# Patient Record
Sex: Male | Born: 1950 | Race: Black or African American | Hispanic: No | Smoking: Current every day smoker
Health system: Southern US, Community
[De-identification: ages and names within clinical notes are randomized; demographics above are authoritative.]

## PROBLEM LIST (undated history)

## (undated) DIAGNOSIS — K2970 Gastritis, unspecified, without bleeding: Secondary | ICD-10-CM

## (undated) DIAGNOSIS — I639 Cerebral infarction, unspecified: Secondary | ICD-10-CM

## (undated) DIAGNOSIS — B1910 Unspecified viral hepatitis B without hepatic coma: Secondary | ICD-10-CM

## (undated) HISTORY — PX: JOINT REPLACEMENT: SHX530

## (undated) HISTORY — PX: BACK SURGERY: SHX140

## (undated) HISTORY — PX: OTHER SURGICAL HISTORY: SHX169

---

## 1997-09-02 ENCOUNTER — Emergency Department (HOSPITAL_COMMUNITY): Admission: EM | Admit: 1997-09-02 | Discharge: 1997-09-02 | Payer: Self-pay | Admitting: Emergency Medicine

## 2000-01-12 ENCOUNTER — Emergency Department (HOSPITAL_COMMUNITY): Admission: EM | Admit: 2000-01-12 | Discharge: 2000-01-12 | Payer: Self-pay | Admitting: Emergency Medicine

## 2000-05-03 ENCOUNTER — Emergency Department (HOSPITAL_COMMUNITY): Admission: EM | Admit: 2000-05-03 | Discharge: 2000-05-03 | Payer: Self-pay | Admitting: Emergency Medicine

## 2000-05-03 ENCOUNTER — Encounter: Payer: Self-pay | Admitting: Emergency Medicine

## 2001-03-06 ENCOUNTER — Encounter: Payer: Self-pay | Admitting: Emergency Medicine

## 2001-03-06 ENCOUNTER — Emergency Department (HOSPITAL_COMMUNITY): Admission: EM | Admit: 2001-03-06 | Discharge: 2001-03-06 | Payer: Self-pay | Admitting: Emergency Medicine

## 2001-03-07 ENCOUNTER — Encounter: Payer: Self-pay | Admitting: Emergency Medicine

## 2001-03-07 ENCOUNTER — Emergency Department (HOSPITAL_COMMUNITY): Admission: EM | Admit: 2001-03-07 | Discharge: 2001-03-07 | Payer: Self-pay | Admitting: Emergency Medicine

## 2002-05-22 ENCOUNTER — Emergency Department (HOSPITAL_COMMUNITY): Admission: EM | Admit: 2002-05-22 | Discharge: 2002-05-22 | Payer: Self-pay | Admitting: Emergency Medicine

## 2002-05-22 ENCOUNTER — Encounter: Payer: Self-pay | Admitting: Emergency Medicine

## 2003-01-16 ENCOUNTER — Emergency Department (HOSPITAL_COMMUNITY): Admission: EM | Admit: 2003-01-16 | Discharge: 2003-01-16 | Payer: Self-pay | Admitting: Emergency Medicine

## 2003-01-24 ENCOUNTER — Emergency Department (HOSPITAL_COMMUNITY): Admission: EM | Admit: 2003-01-24 | Discharge: 2003-01-24 | Payer: Self-pay | Admitting: Emergency Medicine

## 2003-03-30 ENCOUNTER — Emergency Department (HOSPITAL_COMMUNITY): Admission: AD | Admit: 2003-03-30 | Discharge: 2003-03-30 | Payer: Self-pay | Admitting: Emergency Medicine

## 2004-09-09 ENCOUNTER — Emergency Department (HOSPITAL_COMMUNITY): Admission: EM | Admit: 2004-09-09 | Discharge: 2004-09-09 | Payer: Self-pay | Admitting: Family Medicine

## 2004-11-14 ENCOUNTER — Emergency Department (HOSPITAL_COMMUNITY): Admission: EM | Admit: 2004-11-14 | Discharge: 2004-11-14 | Payer: Self-pay | Admitting: Emergency Medicine

## 2005-03-15 ENCOUNTER — Emergency Department (HOSPITAL_COMMUNITY): Admission: EM | Admit: 2005-03-15 | Discharge: 2005-03-15 | Payer: Self-pay | Admitting: Emergency Medicine

## 2006-04-18 ENCOUNTER — Emergency Department (HOSPITAL_COMMUNITY): Admission: EM | Admit: 2006-04-18 | Discharge: 2006-04-18 | Payer: Self-pay | Admitting: Family Medicine

## 2006-11-07 ENCOUNTER — Emergency Department (HOSPITAL_COMMUNITY): Admission: EM | Admit: 2006-11-07 | Discharge: 2006-11-07 | Payer: Self-pay | Admitting: Emergency Medicine

## 2007-04-23 ENCOUNTER — Emergency Department (HOSPITAL_COMMUNITY): Admission: EM | Admit: 2007-04-23 | Discharge: 2007-04-23 | Payer: Self-pay | Admitting: Emergency Medicine

## 2008-03-07 ENCOUNTER — Inpatient Hospital Stay (HOSPITAL_COMMUNITY): Admission: EM | Admit: 2008-03-07 | Discharge: 2008-03-11 | Payer: Self-pay | Admitting: Emergency Medicine

## 2010-04-04 ENCOUNTER — Emergency Department (HOSPITAL_COMMUNITY)
Admission: EM | Admit: 2010-04-04 | Discharge: 2010-04-04 | Payer: Self-pay | Source: Home / Self Care | Admitting: Emergency Medicine

## 2010-07-13 LAB — BASIC METABOLIC PANEL
BUN: 17 mg/dL (ref 6–23)
CO2: 21 mEq/L (ref 19–32)
Calcium: 9.1 mg/dL (ref 8.4–10.5)
Chloride: 103 mEq/L (ref 96–112)
Creatinine, Ser: 1.12 mg/dL (ref 0.4–1.5)
GFR calc Af Amer: >60 mL/min (ref >60)
GFR calc non Af Amer: >60 mL/min (ref >60)
Glucose, Bld: 94 mg/dL (ref 70–99)
Potassium: 3.5 mEq/L (ref 3.5–5.1)
Sodium: 138 mEq/L (ref 135–145)

## 2010-07-13 LAB — DIFFERENTIAL
Basophils Absolute: 0 10*3/uL (ref 0.0–0.1)
Basophils Relative: 0 % (ref 0–1)
Eosinophils Absolute: 0.1 10*3/uL (ref 0.0–0.7)
Eosinophils Relative: 1 % (ref 0–5)
Lymphocytes Relative: 32 % (ref 12–46)
Lymphs Abs: 2.4 10*3/uL (ref 0.7–4.0)
Monocytes Absolute: 0.5 10*3/uL (ref 0.1–1.0)
Monocytes Relative: 7 % (ref 3–12)
Neutro Abs: 4.5 10*3/uL (ref 1.7–7.7)
Neutrophils Relative %: 60 % (ref 43–77)

## 2010-07-13 LAB — CBC
HCT: 35.4 % — ABNORMAL LOW (ref 39.0–52.0)
Hemoglobin: 12.8 g/dL — ABNORMAL LOW (ref 13.0–17.0)
MCH: 30.4 pg (ref 26.0–34.0)
MCHC: 36.2 g/dL — ABNORMAL HIGH (ref 30.0–36.0)
MCV: 84.1 fL (ref 78.0–100.0)
Platelets: 318 10*3/uL (ref 150–400)
RBC: 4.21 MIL/uL — ABNORMAL LOW (ref 4.22–5.81)
RDW: 14.3 % (ref 11.5–15.5)
WBC: 7.4 10*3/uL (ref 4.0–10.5)

## 2010-07-13 LAB — RAPID URINE DRUG SCREEN, HOSP PERFORMED
Amphetamines: NOT DETECTED
Barbiturates: NOT DETECTED
Benzodiazepines: POSITIVE — AB
Cocaine: POSITIVE — AB
Opiates: NOT DETECTED
Tetrahydrocannabinol: POSITIVE — AB

## 2010-07-13 LAB — ETHANOL: Alcohol, Ethyl (B): 193 mg/dL — ABNORMAL HIGH (ref 0–10)

## 2010-09-14 NOTE — Discharge Summary (Signed)
NAMEROXY, FILLER               ACCOUNT NO.:  [id number]   MEDICAL RECORD NO.:  [id number]          PATIENT TYPE:  INP   LOCATION:  1433                         FACILITY:  [facility]   PHYSICIAN:  Michelene Gardener, MD    DATE OF BIRTH:  [birth year]   DATE OF ADMISSION:  03/07/2008  DATE OF DISCHARGE:  03/11/2008                               DISCHARGE SUMMARY   DISCHARGE DIAGNOSES:  1. 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  2. Somatoform disorder.  3. History of alcoholism.  4. History of tobacco abuse.  5. History of drug abuse.   DISCHARGE MEDICATIONS:  The patient will go home on the same medications  that include:   1. Multivitamin 1 tab p.o. once daily.  2. Ibuprofen as needed for pain.  3. Amitriptyline at bedtime.   CONSULTATIONS:  1. Neurology consult.  2. Psych consult.   PROCEDURES:  None.   RADIOLOGY STUDIES:  1. MRI of the cervical spine without contrast showed degenerative      change most  prominent in C3-C4 level.  2. MRI of the thoracic spine showed mild degenerative change without      compression.  3. MRI of the lumbar spine showed mild degenerative change most      significantly in L4-L5.  4.  CT scan of the head without contrast      on November 6th showed normal examination.  4. MRI of the brain without contrast showed no acute findings.  5. MRA of the brain without contrast showed no acute findings.   FOLLOWUP:  With the primary doctor in 1-2 weeks.   COURSE OF HOSPITALIZATION:  This is a 60 year old African American male  who presented to the hospital complaining of bilateral lower extremity  weakness and chronic back pain for the last 8 months.  The patient was  admitted to the hospital for further evaluation.  A CT scan of the head  was done and came to be normal.  MRI of the cervical spine, thoracic  spine, and lumbar spine were done, and they came to be negative apart  from mild degenerative changes.  Neurology evaluation was done.  The  patient was seen moving his lower extremities by nurses when he was not  watched.  Upon examination he can move his legs with pain stimuli.  Neurology thinks that he does not have anything neurological, and that  it might be compression disorder.  Psych evaluation was done, and the  patient was diagnosed with somatoform disorder.  On November 10 the  patient is totally back to normal.  He was  able to walk without any problems.  Seen by PT who recommended home  health PT.  The patient will be discharged home today.  Will be followed  with primary doctor within 1 week.   Total assessment time is 40 minutes.      Michelene Gardener, MD  Electronically Signed     NAE/MEDQ  D:  03/11/2008  T:  03/11/2008  Job:  [id number]

## 2010-09-14 NOTE — Consult Note (Signed)
NAME:  GARYSON, STELLY NO.:  [id number]   MEDICAL RECORD NO.:  [id number]          PATIENT TYPE:  EMS   LOCATION:  ED                           FACILITY:  [facility]   PHYSICIAN:  Melina Fiddler, MDDATE OF BIRTH:  05-Feb-1951   DATE OF CONSULTATION:  03/07/2008  DATE OF DISCHARGE:                                 CONSULTATION   REASON FOR CONSULTATION:  Mr. Naji is seen today in consultation at  the request of Dr. Ignacia Palma at [facility] emergency department to  evaluate for spinal cord contusion resulting in sensory deficit and/or  paralysis.   HISTORY OF PRESENT ILLNESS:  This is a 60 year old African American  gentleman with a long history of chronic pain, specifically chronic low  back pain in the lumbar region.  The patient states that he was working  today when he suddenly experienced a sharp and stabbing pain which began  in his low lumbar region and then radiated up his spine towards the base  of his neck.  The patient states after this pain, he felt numb from the  neck down, and paralysed as if he could not move his arms and legs.  He  was promptly transported to the [facility] emergency department for  concerns for spinal cord infarct.  On presentation, his pain was 10/10.  The patient has been evaluated with MRI of the total spine in the [facility] emergency department, and has his strength and sensation have  slowly improved over several hours.  He does report that he was recently  evaluated by a neurosurgeon with consideration for lumbar surgery to  relieve his pain.  It was recommended to him that surgical intervention  was not an option.  He is currently awaiting a second opinion from  another neurosurgeon in [place].  At today's visit, the patient  states that his pain is significantly exacerbated in all positions.  He  has difficulty sitting for long periods of time.  He has difficulty  walking as well as difficulty lying flat.  He is  able to lie on his  side.  While in the emergency department, the patient received several  pain medications.  He has received approximately 6 mg of hydromorphone,  25 mg of Benadryl, one Percocet and 8 mg of Zofran.   OTHER PAST MEDICAL HISTORY:  1. Total right knee replacement.  2. Left ankle orthopedic procedure status post crush injury.  3. Chronic pain, specifically chronic low back pain.  4. Hepatitis C.  5. History of migraine headache.  6. Ill-defined kidney problems, possibly chronic renal insufficiency      secondary to NSAID use.  7. Depression.  8. Gastroesophageal reflux disease.  9. Polysubstance abuse, + marj, +cocaine   MEDICATIONS:  The patient reports no current home medications.   ALLERGIES:  NSAIDS, TYLENOL AND MOTRIN.  HE ALSO REPORTS THAT HE HAS  SOME SORT OF REACTION TO ALL OVER-THE-COUNTER PAIN RELIEVERS DUE TO THE  FACT THAT HE HAS TAKEN SO MANY OF THEM OVER HIS LIFETIME.   FAMILY HISTORY:  Noncontributory.   SOCIAL HISTORY:  He is a current smoker.  He reports occasional alcohol,  however the medical computer record indicates a history of alcohol  abuse.  He reports current cannabis use.   REVIEW OF SYSTEMS:  As detailed above under HPI, otherwise the balance  of 10 systems is unremarkable.  Specifically, the patient does not  report any bladder or bowel urgency, nor does he report any urinary  retention or difficulty passing stool.   PHYSICAL EXAMINATION:  VITAL SIGNS:  Current blood pressure is 132/84,  pulse of 93, temperature 97.9, respiratory rate 20.  He is sating 100%  on room air.  HEENT:  Head is normocephalic, atraumatic.  Pupils are equal, round,  reactive to light and accommodation.  Extraocular movements are full  without nystagmus.  Oropharynx clear without ulcers or exudate.  Palate  is symmetric and uvula midline.  NECK:  Supple.  LUNGS:  Clear to auscultation bilaterally.  HEART:  Regular rate and rhythm.  ABDOMEN:  Benign.   PERIPHERAL EXTREMITIES:  Warm, dry and well perfused without clubbing,  cyanosis or edema.  Of note, the patient does have two well-healed  lesions on the right forearm.  He states that these are areas where he  has had needle sticks in the past and they have not healed well.  NEUROLOGIC:  The patient is alert and oriented x3 to person, place and  time.  Memory to both recent and remote events is intact.  Attention and  concentration are within normal limits.  Language is fluent when the  patient is fully engaged, but he mumbles a lot of answers and does not  make good eye contact.  Cranial nerves II through XII are grossly  intact.  MUSCULOSKELETAL:  The patient exhibits normal tone with reduced bulk  throughout.  He appears to be poorly nourished.  There is no pronator  drift.  Strength is -5 to 5/5 throughout, depending an effort.  He does  have some mild give-way weakness and some weakness secondary to low back  pain, specifically his right hip flexors are +4/5 secondary to pain.  Toes are downgoing bilaterally.  DTRs are +1 at the ankles, +2 at the  left patellar tendon and +1 on the right, most likely secondary to total  knee replacement.  In the upper extremities biceps are 2+, triceps and  supinator are 1+.  The patient has smooth pursuit on finger-to-nose  testing.  Gait:  The patient refused to ambulate but did sit on the side  of the bed with legs dangling over.  He sat without assistance and was  steady and able to participate fully throughout the entire exam.  Sensation appears to be intact to pinprick and light touch.  The patient  does have mild to moderate decreased vibratory sense in his distal lower  extremities.  This is more notable on the left greater than the right.   CT of the head was within normal limits.  MRI of the total spine did not  show any evidence for spinal cord infarct, demyelination or myelopathy.  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  Chem-7 was within normal limits.  The patient has  elevated transaminases, AST 65, ALT 55, albumin is low at 3.4, one set  of cardiac enzymes is normal.   ASSESSMENT AND PLAN:  This is a 60 year old African American gentleman  with a history of chronic low back pain.  After neurologic examination  and neuro imaging;  the patient's exam has improved significantly over  the last several hours while in the [facility] emergency department.  At present he is able to sit up on the side of the bed without  assistance.  He is steady.  He is able to fully participate in the  Neurologic exam and has near full strength with normal reflexes and  downgoing toes.  He has no evidence on neurologic exam for spinal cord  infarct, demyelination or myelopathy, and specifically no focal lesion  is seen and his exam does not localize to any particular spinal level.  The patient has received a number of pain medications as well as  antiemetics while in the emergency department and is being admitted for  observation.  I do not feel that he has any acute neurologic issue on  his exam, I would consider while hospitalized or as an outpatient  checking a B12 level as well as an RPR due to his decreased vibratory  sense in his distal lower extremities and his h/o of polysubstance  abuse.  He also appears to be poorly nourished with a low albumin.  I  recommend thiamine and folate supplementation and monitoring for EtOH  withdrawal. For depression, one could consider starting an SSRI or  amitriptyline.  Amitriptyline is generic and the patient may be  compliant with this due to affordability.  This patient will be followed  by [facility] in the a.m.Melina Fiddler, MD  Electronically Signed     NA/MEDQ  D:  03/07/2008  T:  03/08/2008  Job:  [id number]

## 2010-09-14 NOTE — H&P (Signed)
NAMEJAHZIER, [name]               ACCOUNT NO.:  [id number]   MEDICAL RECORD NO.:  [id number]          PATIENT TYPE:  INP   LOCATION:  1433                         [facility]ACILITY[facility]  [facility]   PHYSICIAN:  Eduard Clos, MDDATE OF BIRTH:  1950/10/26   DATE OF ADMISSION:  03/07/2008  DATE OF DISCHARGE:                              HISTORY & PHYSICAL   PRIMARY CARE PHYSICIAN:  Unassigned.   CHIEF COMPLAINT:  Lower extremity weakness.   HISTORY OF PRESENT ILLNESS:  This is a 60 year old male with history of  chronic alcoholism,  tobacco abuse, drug abuse, presents to the ER  complaining of increasing weakness in the lower extremities.  The  patient states that he has been having chronic low back pain over the  last eight months.  At times gets weakness of his legs but today noted  sharp, shooting pain all the way up from his lumbar to his neck after  which he started developing some sudden weakness of both lower  extremities, unable to walk.  The patient had an MRI of his C-spine, T-  spine and lumbar spine.  No changes seen at this time but the patient  when tried to be mobilized, almost fell but did not lose consciousness.  The patient has been admitted for further evaluation and management.  Neurology has  been already called and consulted.   The patient denies any fever or chills, loss of consciousness, weakness  of upper extremities.  Denies any incontinence of urine.  Denies any  headache.  The pain in his low back has been there for eight months and  at times increases and goes up all the way to his C-spine area.  Presently the patient is not in any acute distress; denies any chest  pain, shortness of breath. Denies any nausea, vomiting, diarrhea, fever,  chills.   PAST MEDICAL HISTORY:  Nothing significant.   PAST SURGICAL HISTORY:  Right knee replacement and left ankle surgery.   MEDICATIONS PRIOR TO ADMISSION:  None.   ALLERGIES:  No known drug allergies.   SOCIAL  HISTORY:  The patient lives with his wife.  He smokes cigarettes  daily.  He has been advised to quit smoking.  Drinks a beer daily.  He  has been advised to quit drinking.  At times he takes marijuana and  cocaine which has been noted by his family to have been taken.   FAMILY HISTORY:  Nothing contributory.   REVIEW OF SYSTEMS:  As in history of present illness, nothing else  significant.   PHYSICAL EXAMINATION:  GENERAL:  The patient is examined at the bedside.  Not in acute distress.  VITAL SIGNS: Blood pressure 130/80, pulse 90 per minute and regular,  temperature 97.9, resection 18, oxygen saturation 100%.  HEENT:  Anicteric. No pallor.  PERRLA positive.  No facial asymmetry.  Tongue is midline.  CHEST:  Bilateral air entry.  No rhonchi on auscultation.  HEART:  S1 and S2.  ABDOMEN:  Soft, nontender.  Bowel sounds heard.  No guarding, no  rigidity.  CNS:  Alert and oriented to time, place and  person.  Motor strength in  the upper extremities 5/5.  Lower extremities 3/5 in both lower  extremities.  Ankle and knee reflexes are difficult to elicit due to  positioning.  Poor plantar reflexes.  Sensation grossly intact.  EXTREMITIES:  Peripheral pulses full. No edema.   LABORATORY DATA:  CBC shows WBC 6.3, hemoglobin 13, hematocrit 38.6,  platelets 136,000, neutrophils 36%.  Comprehensive metabolic panel:  Sodium 143, potassium 3.8, chloride 110, CO2 28, glucose 92, BUN 7,  creatinine 0.8, total bilirubin 0.9, alkaline phosphatase 95, AST 65,  ALT 55.  Albumin 3.4, calcium 8.9.  CK-MB 1.  Troponin I less than 0.05.  Myoglobin 59.6.  Urinalysis unremarkable.  CT scan of the head was  normal examination.  MRI of the cervical, thoracic and lumbar spine  without contrast shows degenerative changes, most prominent at C3-4  level but there is mild cord flattening as described above.  Altered  signal intensity of bone marrow, correlation of CBC and differential  recommended to help  explain anemia or infiltrative process.  in the C3-4  and C4-5 is most likely representing degenerative change rather than  infection as noted above.  Minimal degenerative changes without thoracic  cord compression.  Altered signal intensity of of bone marrow.  More  significant degenerative changes at the L4-5 level appearing similar to  that prior exam.  Compared to previous examination, there has been  increased amount of edema surrounding the right L3-4 fracture joint  similar to degenerative changes with follow up.  Heterogenous appearance  of bone marrow MRI of the spine.   ASSESSMENT:  1. Bilateral lower extremity weakness with back pain.  Etiology is not      clear.  2. History of alcoholism.  3. History of tobacco abuse.  4. History of drug abuse.   I will admit the patient to telemetry.  Start the patient on neuro  checks.  Alcohol withdrawal protocol and thiamine and folic acid.  Neurology has been already consult.  Will follow up their  recommendations.  Will place the patient on vital capacity measurement  every 8 hours until the different are available from neurology.      Eduard Clos, MD  Electronically Signed     ANK/MEDQ  D:  03/07/2008  T:  03/08/2008  Job:  [id number]

## 2010-09-17 NOTE — Consult Note (Signed)
NAMEJACOBUS, COLVIN               ACCOUNT NO.:  [id number]   MEDICAL RECORD NO.:  [id number]          PATIENT TYPE:  INP   LOCATION:  1433                         FACILITY:  [facility]   PHYSICIAN:  Antonietta Breach, M.D.  DATE OF BIRTH:  Jul 05, 1950   DATE OF CONSULTATION:  DATE OF DISCHARGE:  03/11/2008                                 CONSULTATION   REASON FOR CONSULTATION:  Somatoform disorder as well as mental status  changes.   HISTORY OF PRESENT ILLNESS:  Mr. Mazzocco is a 60 year old male admitted  to the [facility] on March 07, 2008 after a fall.  He also  has chronic back pain.   Mr. Donoghue has developed approximately 4 days of paraplegia symptoms and  signs.  His neurological organic workup has been completely negative.  He says that he has not been able to move.  However, the nurse will see  him moving when she is glancing back, going out of the room.   His mood is normal.  He does not have any hallucinations or delusions.  His orientation function is intact.  He is not agitated or combative.  He is cooperative with bedside care.   So far the general supportive care at the hospital has not been  associated with any decrease in his symptoms.  It appears that his  symptoms have been associated with his chronic back pain.   PAST PSYCHIATRIC HISTORY:  In review of the past medical record, there  is substance abuse listed with chronic problems with alcohol.  He was  reported as one beer per day.  He has a history of the use of marijuana  and cocaine.   FAMILY PSYCHIATRIC HISTORY:  None known.   SOCIAL HISTORY:  He lives with his wife.  Please see the above history.  He is medically disabled and retired.   PAST MEDICAL HISTORY:  Back pain, chronic.   MEDICATIONS:  MAR is reviewed.  He is on Elavil as well as an Ativan  taper.   ALLERGIES:  Nonsteroidal anti-inflammatory drugs and COX-2 inhibitors.   LABORATORY DATA:  WBC 6.4, hemoglobin 12.2, platelet count  149, sodium  139, BUN 4, creatinine 0.89, glucose 148, SGOT 49, SGPT 42.  Urine drug  screen positive for cocaine and benzodiazepines.   REVIEW OF SYSTEMS:  Constitutional, Head, Eyes, Ears, Nose And Throat,  Mouth, Neurologic, Psychiatric, Cardiovascular, Respiratory,  Gastrointestinal, Genitourinary, Skin, Musculoskeletal, Hematologic,  Lymphatic, Endocrine, Metabolic all unremarkable.   PHYSICAL EXAMINATION:  VITAL SIGNS:  Temperature 97.8, pulse 66,  respiratory rate 18, blood pressure 134/83, O2 saturation on room air is  100%.  GENERAL APPEARANCE:  Mr. Vansickle is a middle-aged male lying in a supine  position in his hospital bed.  He does not have any abnormal involuntary  movements.   MENTAL STATUS EXAM:  Mr. Ramnauth is alert.  His eye contact is good.  His  concentration is within normal limits.  His attention span is normal.  His affect is broad and appropriate, mood within normal limits.  He is  oriented to all spheres.  Memory function is  intact to immediate recent  and remote.  Fund of knowledge and intelligence within normal limits.  Speech involves normal rate and prosody without dysarthria.  There is  some ambivalence about his symptoms.  Thought process logical, coherent,  goal-directed.  No looseness of associations.  Thought content:  No  thoughts of harming himself.  No thoughts of harming others.  No  delusions, no hallucinations.  Insight is poor.  Judgment is intact.  Regarding insight, the term poor represents the fact that the patient  has no insight into unconscious generation of symptoms.   ASSESSMENT:  AXIS I:  1. (294.9)  Unspecified persistent mental disorder NOS, rule out      somatoform disorder not otherwise specified.  At the time of the      exam, Mr. Gleaves is not exhibiting any of the symptoms of impaired      movement or impaired sensation.  Many of these do clear.  There is      a possibility that it will recur.  2. History of polysubstance  dependence.  AXIS II:  Deferred.  AXIS III:  See past medical history.  AXIS IV:  General medical.  AXIS V:  55.   Mr. Shimabukuro is not at risk to harm himself or others.  He agrees to call  emergency services immediately for any thoughts of harming himself,  thoughts of harming others or distress.   The undersigned provided education regarding the psychodynamics of his  somatoform symptoms, provided with basic terminology   RECOMMENDATIONS:  1. The undersigned did recommend that the patient attend outpatient      psychotherapy for coping skills and stress management.  Also, if      these symptoms return, an adequately trained and experienced      psychologist could provide the correct environment and      psychotherapy for the conversion symptoms to be relieved.  2. Would have the [facility]      [facility].  This has been set up for November.  3. He is psychiatrically cleared for discharge.  4. Would ask the social worker to confirm that his [facility] follow[facility] is      intact.  Would also make sure that he will have chemical dependence      follow up and 12-step groups available.      Antonietta Breach, M.D.  Electronically Signed     JW/MEDQ  D:  03/31/2008  T:  03/31/2008  Job:  [id number]

## 2011-02-01 LAB — COMPREHENSIVE METABOLIC PANEL
ALT: 55 — ABNORMAL HIGH
AST: 65 — ABNORMAL HIGH
Albumin: 3.4 — ABNORMAL LOW
Alkaline Phosphatase: 95
BUN: 4 — ABNORMAL LOW
BUN: 7
CO2: 26
CO2: 28
Calcium: 8.8
Calcium: 8.9
Chloride: 107
Chloride: 110
Creatinine, Ser: 0.86
Creatinine, Ser: 0.89
GFR calc Af Amer: >60
GFR calc non Af Amer: >60
GFR calc non Af Amer: >60
Glucose, Bld: 92
Potassium: 3.8
Sodium: 143
Total Bilirubin: 0.6
Total Bilirubin: 0.9
Total Protein: 6.3

## 2011-02-01 LAB — URINALYSIS, ROUTINE W REFLEX MICROSCOPIC
Bilirubin Urine: NEGATIVE
Glucose, UA: NEGATIVE
Hgb urine dipstick: NEGATIVE
Ketones, ur: NEGATIVE
Protein, ur: NEGATIVE
Specific Gravity, Urine: 1.021
pH: 5

## 2011-02-01 LAB — URINE MICROSCOPIC-ADD ON

## 2011-02-01 LAB — CBC
HCT: 36.2 — ABNORMAL LOW
HCT: 38.6 — ABNORMAL LOW
Hemoglobin: 13
MCHC: 33.6
MCHC: 33.6
MCV: 96.9
MCV: 97.1
Platelets: 149 — ABNORMAL LOW
Platelets: 176
RBC: 3.74 — ABNORMAL LOW
RBC: 3.98 — ABNORMAL LOW
RDW: 14.1
WBC: 6.3
WBC: 6.4

## 2011-02-01 LAB — RAPID URINE DRUG SCREEN, HOSP PERFORMED
Amphetamines: NOT DETECTED
Benzodiazepines: NOT DETECTED
Cocaine: POSITIVE — AB

## 2011-02-01 LAB — DIFFERENTIAL
Basophils Absolute: 0
Basophils Relative: 1
Eosinophils Absolute: 0.1
Eosinophils Relative: 2
Lymphocytes Relative: 32
Monocytes Absolute: 0.4

## 2011-02-01 LAB — CARDIAC PANEL(CRET KIN+CKTOT+MB+TROPI)
CK, MB: 1.7
CK, MB: 2.1
Total CK: 109
Total CK: 132
Troponin I: 0.01
Troponin I: 0.01

## 2011-02-01 LAB — LIPID PANEL
Cholesterol: 152
HDL: 55

## 2011-02-01 LAB — TSH: TSH: 1.216

## 2011-02-01 LAB — POCT CARDIAC MARKERS: Myoglobin, poc: 59.6

## 2011-11-21 ENCOUNTER — Emergency Department (HOSPITAL_COMMUNITY)
Admission: EM | Admit: 2011-11-21 | Discharge: 2011-11-21 | Disposition: A | Discharge status: Home / Self Care | Payer: PRIVATE HEALTH INSURANCE | Attending: Emergency Medicine | Admitting: Emergency Medicine

## 2011-11-21 ENCOUNTER — Encounter (HOSPITAL_COMMUNITY): Payer: Self-pay | Admitting: Nurse Practitioner

## 2011-11-21 ENCOUNTER — Emergency Department (HOSPITAL_COMMUNITY): Payer: PRIVATE HEALTH INSURANCE

## 2011-11-21 DIAGNOSIS — R4182 Altered mental status, unspecified: Secondary | ICD-10-CM | POA: Insufficient documentation

## 2011-11-21 DIAGNOSIS — M549 Dorsalgia, unspecified: Secondary | ICD-10-CM | POA: Insufficient documentation

## 2011-11-21 DIAGNOSIS — Y9210 Unspecified residential institution as the place of occurrence of the external cause: Secondary | ICD-10-CM | POA: Insufficient documentation

## 2011-11-21 DIAGNOSIS — R059 Cough, unspecified: Secondary | ICD-10-CM | POA: Insufficient documentation

## 2011-11-21 DIAGNOSIS — G319 Degenerative disease of nervous system, unspecified: Secondary | ICD-10-CM | POA: Insufficient documentation

## 2011-11-21 DIAGNOSIS — W19XXXA Unspecified fall, initial encounter: Secondary | ICD-10-CM | POA: Insufficient documentation

## 2011-11-21 DIAGNOSIS — R55 Syncope and collapse: Secondary | ICD-10-CM | POA: Insufficient documentation

## 2011-11-21 DIAGNOSIS — R05 Cough: Secondary | ICD-10-CM | POA: Insufficient documentation

## 2011-11-21 DIAGNOSIS — M542 Cervicalgia: Secondary | ICD-10-CM | POA: Insufficient documentation

## 2011-11-21 DIAGNOSIS — R079 Chest pain, unspecified: Secondary | ICD-10-CM | POA: Insufficient documentation

## 2011-11-21 HISTORY — DX: Unspecified viral hepatitis B without hepatic coma: B19.10

## 2011-11-21 HISTORY — DX: Gastritis, unspecified, without bleeding: K29.70

## 2011-11-21 LAB — CARDIAC PANEL(CRET KIN+CKTOT+MB+TROPI)
Relative Index: 1.3 (ref 0.0–2.5)
Total CK: 118 U/L (ref 7–232)
Troponin I: <0.3 ng/mL (ref <0.30)

## 2011-11-21 LAB — COMPREHENSIVE METABOLIC PANEL
ALT: 32 U/L (ref 0–53)
Albumin: 3.9 g/dL (ref 3.5–5.2)
Alkaline Phosphatase: 68 U/L (ref 39–117)
BUN: 12 mg/dL (ref 6–23)
Potassium: 3.3 mEq/L — ABNORMAL LOW (ref 3.5–5.1)
Sodium: 139 mEq/L (ref 135–145)
Total Protein: 7.9 g/dL (ref 6.0–8.3)

## 2011-11-21 LAB — CBC WITH DIFFERENTIAL/PLATELET
Basophils Absolute: 0 10*3/uL (ref 0.0–0.1)
Basophils Relative: 0 % (ref 0–1)
Eosinophils Absolute: 0.2 10*3/uL (ref 0.0–0.7)
MCH: 29.8 pg (ref 26.0–34.0)
MCHC: 35 g/dL (ref 30.0–36.0)
Neutrophils Relative %: 38 % — ABNORMAL LOW (ref 43–77)
Platelets: 224 10*3/uL (ref 150–400)
RDW: 14.4 % (ref 11.5–15.5)

## 2011-11-21 LAB — URINALYSIS, ROUTINE W REFLEX MICROSCOPIC
Glucose, UA: NEGATIVE mg/dL
Ketones, ur: NEGATIVE mg/dL
Leukocytes, UA: NEGATIVE
pH: 5.5 (ref 5.0–8.0)

## 2011-11-21 LAB — RAPID URINE DRUG SCREEN, HOSP PERFORMED: Benzodiazepines: NOT DETECTED

## 2011-11-21 LAB — GLUCOSE, CAPILLARY: Glucose-Capillary: 110 mg/dL — ABNORMAL HIGH (ref 70–99)

## 2011-11-21 MED ORDER — MORPHINE SULFATE 4 MG/ML IJ SOLN
4.0000 mg | Freq: Once | INTRAMUSCULAR | Status: AC
  Administered 2011-11-21: 4 mg via INTRAVENOUS
  Filled 2011-11-21: qty 1

## 2011-11-21 MED ORDER — HYDROMORPHONE HCL PF 1 MG/ML IJ SOLN
1.0000 mg | Freq: Once | INTRAMUSCULAR | Status: AC
  Administered 2011-11-21: 1 mg via INTRAVENOUS
  Filled 2011-11-21: qty 1

## 2011-11-21 MED ORDER — GI COCKTAIL ~~LOC~~
30.0000 mL | Freq: Once | ORAL | Status: AC
  Administered 2011-11-21: 30 mL via ORAL
  Filled 2011-11-21: qty 30

## 2011-11-21 MED ORDER — FAMOTIDINE 20 MG PO TABS
20.0000 mg | ORAL_TABLET | Freq: Once | ORAL | Status: AC
  Administered 2011-11-21: 20 mg via ORAL
  Filled 2011-11-21: qty 1

## 2011-11-21 MED ORDER — ONDANSETRON HCL 4 MG/2ML IJ SOLN
4.0000 mg | Freq: Once | INTRAMUSCULAR | Status: AC
  Administered 2011-11-21: 4 mg via INTRAVENOUS
  Filled 2011-11-21: qty 2

## 2011-11-21 NOTE — ED Notes (Signed)
Ct notified to return pt to CDU 9

## 2011-11-21 NOTE — ED Provider Notes (Signed)
Medical screening examination/treatment/procedure(s) were performed by non-physician practitioner and as supervising physician I was immediately available for consultation/collaboration.  Raeford Razor, MD 11/21/11 2035

## 2011-11-21 NOTE — ED Notes (Signed)
C-collar removed per ED MD.

## 2011-11-21 NOTE — ED Notes (Signed)
Patient transported to CT 

## 2011-11-21 NOTE — ED Provider Notes (Signed)
History     CSN: [id number]  Arrival date & time 11/21/11  1226   First MD Initiated Contact with Patient 11/21/11 1241      Chief Complaint  Patient presents with  . Altered Mental Status    (Consider location/radiation/quality/duration/timing/severity/associated sxs/prior treatment) HPI Comments: Patient states he was coming to the hospital to see his daughter who is in the ICU. He states that he has had some pain in his chest and when he walked into the hospital he stated that his chest was hurting and he laid down on the ground. At that point he was blinking his eyes but refusing to speak but breathing normally and in no distress. On arrival here patient is speaking in full sentences without any sign of seizure activity or syncope. There is no tongue biting, incontinence or signs of injury. He is stating that he has neck pain and back pain. However he states he's had multiple prior surgeries and that is why his back hurts. He has no past medical history of cardiac pathology but does have a history of hepatitis B and gastritis.  Patient is a 61 y.o. male presenting with chest pain. The history is provided by the patient.  Chest Pain The chest pain began 3 - 5 hours ago. Chest pain occurs constantly. The chest pain is unchanged. The pain is associated with breathing and coughing. At its most intense, the pain is at 6/10. The pain is currently at 6/10. The severity of the pain is moderate. The quality of the pain is described as pleuritic, sharp and stabbing. The pain does not radiate. Primary symptoms include cough. Pertinent negatives for primary symptoms include no wheezing, no palpitations, no nausea and no vomiting.  The cough began 2 days ago. The cough is productive.  Associated symptoms include near-syncope.  Pertinent negatives for associated symptoms include no diaphoresis. He tried nothing for the symptoms. Risk factors include no known risk factors.     Past Medical History    Diagnosis Date  . Hepatitis B   . Gastritis     Past Surgical History  Procedure Date  . Joint replacement   . Back surgery     History reviewed. No pertinent family history.  History  Substance Use Topics  . Smoking status: Current Everyday Smoker -- 0.0 packs/day  . Smokeless tobacco: Not on file  . Alcohol Use: Yes     occasional      Review of Systems  Constitutional: Negative for diaphoresis.  Respiratory: Positive for cough. Negative for wheezing.   Cardiovascular: Positive for chest pain and near-syncope. Negative for palpitations.  Gastrointestinal: Negative for nausea and vomiting.  All other systems reviewed and are negative.    Allergies  Review of patient's allergies indicates no known allergies.  Home Medications  No current outpatient prescriptions on file.  BP 127/87  Pulse 78  Temp 98 F (36.7 C) (Oral)  Resp 18  SpO2 99%  Physical Exam  Nursing note and vitals reviewed. Constitutional: He is oriented to person, place, and time. He appears well-developed and well-nourished. No distress.  HENT:  Head: Normocephalic and atraumatic.  Mouth/Throat: Oropharynx is clear and moist.  Eyes: Conjunctivae and EOM are normal. Pupils are equal, round, and reactive to light.  Neck: Normal range of motion. Neck supple. Spinous process tenderness and muscular tenderness present.  Cardiovascular: Normal rate, regular rhythm and intact distal pulses.   No murmur heard. Pulmonary/Chest: Effort normal and breath sounds normal. No respiratory distress. He  has no wheezes. He has no rales. He exhibits tenderness.    Abdominal: Soft. He exhibits no distension. There is no tenderness. There is no rebound and no guarding.  Musculoskeletal: Normal range of motion. He exhibits no edema and no tenderness.       Lumbar back: He exhibits tenderness. He exhibits normal range of motion and no bony tenderness.  Neurological: He is alert and oriented to person, place, and  time.  Skin: Skin is warm and dry. No rash noted. No erythema.  Psychiatric: He has a normal mood and affect. His behavior is normal.    ED Course  Procedures (including critical care time)  Labs Reviewed  CBC WITH DIFFERENTIAL - Abnormal; Notable for the following:    Neutrophils Relative 38 (*)     Lymphocytes Relative 52 (*)     All other components within normal limits  COMPREHENSIVE METABOLIC PANEL - Abnormal; Notable for the following:    Potassium 3.3 (*)     Glucose, Bld 100 (*)     GFR calc non Af Amer 79 (*)     All other components within normal limits  GLUCOSE, CAPILLARY - Abnormal; Notable for the following:    Glucose-Capillary 110 (*)     All other components within normal limits  URINALYSIS, ROUTINE W REFLEX MICROSCOPIC  CARDIAC PANEL(CRET KIN+CKTOT+MB+TROPI)  URINE RAPID DRUG SCREEN (HOSP PERFORMED)   Dg Chest 2 View  11/21/2011  *RADIOLOGY REPORT*  Clinical Data: Syncope with fall.  CHEST - 2 VIEW  Comparison: 11/07/2006  Findings: Two-view exam shows no edema or focal airspace consolidation.  No pleural effusion.  1 cm pulmonary nodule is seen in the right lower lung. The cardiopericardial silhouette is within normal limits for size. Imaged bony structures of the thorax are intact.  IMPRESSION: Right lung nodule without acute findings.  CT chest without contrast recommended to further evaluate.  Original Report Authenticated By: ERIC A. MANSELL, M.D.   Dg Lumbar Spine Complete  11/21/2011  *RADIOLOGY REPORT*  Clinical Data: Syncope, fall, back pain  LUMBAR SPINE - COMPLETE 4+ VIEW  Comparison: MRI lumbar spine dated 04/23/2007  Findings: Five lumbar-type vertebral bodies.  Normal lumbar lordosis.  No evidence of fracture or dislocation.  Vertebral body heights are maintained.  Moderate multilevel degenerative changes.  Visualized bony pelvis appears intact.  IMPRESSION: No fracture or dislocation is seen.  Moderate multilevel degenerative changes.  Original Report  Authenticated By: Charline Bills, M.D.   Ct Head Wo Contrast  11/21/2011  *RADIOLOGY REPORT*  Clinical Data:  Fall, neck pain, possible loss of consciousness, hurts all over  CT HEAD WITHOUT CONTRAST CT CERVICAL SPINE WITHOUT CONTRAST  Technique:  Multidetector CT imaging of the head and cervical spine was performed following the standard protocol without intravenous contrast.  Multiplanar CT image reconstructions of the cervical spine were also generated.  Comparison:  None  CT HEAD  Findings: Mild atrophy. Normal ventricular morphology. No midline shift or mass effect. No intracranial hemorrhage, mass lesion, or evidence of acute infarction. No extra-axial fluid collections. Visualized paranasal sinuses and mastoid air cells clear. Skull intact.  IMPRESSION: No acute intracranial abnormalities.  CT CERVICAL SPINE  Findings: Visualized skull base intact. Lung apices clear. Prior anterior fusions C3-C6. Mild degenerative disc disease changes C2-C3. Prevertebral soft tissues normal thickness. Disc prostheses are noted at the surgical levels with at least partial incorporation at C3-C4 C4-C5, uncertain at C5-C6. Vertebral body heights maintained without fracture or subluxation. Minimal scattered facet degenerative changes. Minimal  lucency identified surrounding the screws at C6.  IMPRESSION: Prior C3-C6 anterior fusion. Minimal lucency surrounding the screws at C6 could represent loosening. No definite acute cervical spine injuries identified.  Original Report Authenticated By: Lollie Marrow, M.D.   Ct Cervical Spine Wo Contrast  11/21/2011  *RADIOLOGY REPORT*  Clinical Data:  Fall, neck pain, possible loss of consciousness, hurts all over  CT HEAD WITHOUT CONTRAST CT CERVICAL SPINE WITHOUT CONTRAST  Technique:  Multidetector CT imaging of the head and cervical spine was performed following the standard protocol without intravenous contrast.  Multiplanar CT image reconstructions of the cervical spine were also  generated.  Comparison:  None  CT HEAD  Findings: Mild atrophy. Normal ventricular morphology. No midline shift or mass effect. No intracranial hemorrhage, mass lesion, or evidence of acute infarction. No extra-axial fluid collections. Visualized paranasal sinuses and mastoid air cells clear. Skull intact.  IMPRESSION: No acute intracranial abnormalities.  CT CERVICAL SPINE  Findings: Visualized skull base intact. Lung apices clear. Prior anterior fusions C3-C6. Mild degenerative disc disease changes C2-C3. Prevertebral soft tissues normal thickness. Disc prostheses are noted at the surgical levels with at least partial incorporation at C3-C4 C4-C5, uncertain at C5-C6. Vertebral body heights maintained without fracture or subluxation. Minimal scattered facet degenerative changes. Minimal lucency identified surrounding the screws at C6.  IMPRESSION: Prior C3-C6 anterior fusion. Minimal lucency surrounding the screws at C6 could represent loosening. No definite acute cervical spine injuries identified.  Original Report Authenticated By: Lollie Marrow, M.D.     Date: 11/21/2011  Rate: 80  Rhythm: normal sinus rhythm  QRS Axis: normal  Intervals: normal  ST/T Wave abnormalities: normal  Conduction Disutrbances: none  Narrative Interpretation: unremarkable     No diagnosis found.    MDM   Patient with a history of chest pain that is nonspecific which she states was hurting and so badly today it caused him to fall to the ground as he was coming into the hospital to see his daughter. He was refusing to speak bland rapid response showed up but had no seizure-like activity and was awake and conscious. On arrival here patient began speaking after multiple questions were asked. It also turns out that his daughter is in the ICU right now with rhabdomyolysis. Patient has been very stressed and concerned about his daughter and feel that this may be partly the cause of his symptoms.  Because the fall was not  completely witnessed he is complaining of C-spine and L-spine tenderness and images will be done as well as a head CT. CBC, CMP, urine drug screen, cardiac enzymes pending. The patient's EKG is within normal limits.  3:07 PM All labs within normal limits. C-spine showing anterior fusion with possible loosening of C6 however on reevaluation patient has no neck pain and is able to fully range his neck without pain. C-spine was cleared. On reevaluation patient is no longer complaining of chest pain but states his back hurts. Badly causing him to have pain in his legs. He has no noticeable weakness in his leg at this point but states that this happened about a year ago which required him to be hospitalized for multiple days.  Will give another dose of pain control, check a three-hour mark for him based on chest x-ray recommendations will get a CT of his chest.      Gwyneth Sprout, MD 11/21/11 [phone]

## 2011-11-21 NOTE — ED Notes (Signed)
Pt was walking into hospital when bystanders saw the pt falling to ground stating his chest was hurting. Rapid response was called to scene and found pt lying on ground on back alert but nonverbal with no obvious injuries. Pt breathing easily. Emergency staff to scene, pt was log rolled with c-spine stabilized onto a sheet and lifted onto stretched taken to ed. En route pt increasingly verbal when encouraged.

## 2011-11-21 NOTE — ED Provider Notes (Signed)
Pt placed in CDU to complete work-up. He was waiting for a chest CT to get a better luck at chest nodules. The nodules were found to be boney in structure and not concerning. The patients neuro exam has been redone to asses his weakness. He was able to lift both legs against resistance. He does endorse feeling weak still. He has a cain he uses at baseline and was able to ambulate. He requests food and wants to know if he can stay overnight with his daughter in the ICU. I have informed him he will need to ask them upstairs about that in the ICU and that I do not know the answer but that he can eat. I ordered Pepcid for his heart burn. Pt safe to DC at this time. I do not believe his needs MR of lumbar spine due to good strength on examination. Pt has been advised of the symptoms that warrant their return to the ED. Patient has voiced understanding and has agreed to follow-up with the PCP or specialist.   Dorthula Matas, PA 11/21/11 1749

## 2014-06-03 ENCOUNTER — Emergency Department (HOSPITAL_COMMUNITY): Payer: Medicare Other

## 2014-06-03 ENCOUNTER — Encounter (HOSPITAL_COMMUNITY): Payer: Self-pay | Admitting: Adult Health

## 2014-06-03 ENCOUNTER — Emergency Department (HOSPITAL_COMMUNITY)
Admission: EM | Admit: 2014-06-03 | Discharge: 2014-06-03 | Disposition: A | Discharge status: Home / Self Care | Payer: Medicare Other | Attending: Emergency Medicine | Admitting: Emergency Medicine

## 2014-06-03 DIAGNOSIS — Z8673 Personal history of transient ischemic attack (TIA), and cerebral infarction without residual deficits: Secondary | ICD-10-CM | POA: Diagnosis not present

## 2014-06-03 DIAGNOSIS — Z79899 Other long term (current) drug therapy: Secondary | ICD-10-CM | POA: Insufficient documentation

## 2014-06-03 DIAGNOSIS — Z8619 Personal history of other infectious and parasitic diseases: Secondary | ICD-10-CM | POA: Diagnosis not present

## 2014-06-03 DIAGNOSIS — Y998 Other external cause status: Secondary | ICD-10-CM | POA: Insufficient documentation

## 2014-06-03 DIAGNOSIS — Y9289 Other specified places as the place of occurrence of the external cause: Secondary | ICD-10-CM | POA: Diagnosis not present

## 2014-06-03 DIAGNOSIS — Y9389 Activity, other specified: Secondary | ICD-10-CM | POA: Insufficient documentation

## 2014-06-03 DIAGNOSIS — K2970 Gastritis, unspecified, without bleeding: Secondary | ICD-10-CM | POA: Diagnosis not present

## 2014-06-03 DIAGNOSIS — S199XXA Unspecified injury of neck, initial encounter: Secondary | ICD-10-CM | POA: Diagnosis not present

## 2014-06-03 DIAGNOSIS — R55 Syncope and collapse: Secondary | ICD-10-CM | POA: Diagnosis present

## 2014-06-03 DIAGNOSIS — W01198A Fall on same level from slipping, tripping and stumbling with subsequent striking against other object, initial encounter: Secondary | ICD-10-CM | POA: Diagnosis not present

## 2014-06-03 DIAGNOSIS — Z7982 Long term (current) use of aspirin: Secondary | ICD-10-CM | POA: Diagnosis not present

## 2014-06-03 DIAGNOSIS — S0990XA Unspecified injury of head, initial encounter: Secondary | ICD-10-CM | POA: Insufficient documentation

## 2014-06-03 DIAGNOSIS — Z720 Tobacco use: Secondary | ICD-10-CM | POA: Insufficient documentation

## 2014-06-03 DIAGNOSIS — R079 Chest pain, unspecified: Secondary | ICD-10-CM | POA: Diagnosis not present

## 2014-06-03 DIAGNOSIS — W19XXXA Unspecified fall, initial encounter: Secondary | ICD-10-CM

## 2014-06-03 HISTORY — DX: Cerebral infarction, unspecified: I63.9

## 2014-06-03 LAB — CBC
HEMATOCRIT: 42.3 % (ref 39.0–52.0)
Hemoglobin: 15.1 g/dL (ref 13.0–17.0)
MCH: 32.9 pg (ref 26.0–34.0)
MCHC: 35.7 g/dL (ref 30.0–36.0)
MCV: 92.2 fL (ref 78.0–100.0)
Platelets: 183 10*3/uL (ref 150–400)
RBC: 4.59 MIL/uL (ref 4.22–5.81)
RDW: 12.9 % (ref 11.5–15.5)
WBC: 8.8 10*3/uL (ref 4.0–10.5)

## 2014-06-03 LAB — BASIC METABOLIC PANEL
Anion gap: 11 (ref 5–15)
BUN: 17 mg/dL (ref 6–23)
CALCIUM: 10 mg/dL (ref 8.4–10.5)
CO2: 24 mmol/L (ref 19–32)
Chloride: 103 mmol/L (ref 96–112)
Creatinine, Ser: 1.26 mg/dL (ref 0.50–1.35)
GFR, EST AFRICAN AMERICAN: 69 mL/min — AB (ref >90)
GFR, EST NON AFRICAN AMERICAN: 60 mL/min — AB (ref >90)
GLUCOSE: 93 mg/dL (ref 70–99)
Potassium: 3.2 mmol/L — ABNORMAL LOW (ref 3.5–5.1)
SODIUM: 138 mmol/L (ref 135–145)

## 2014-06-03 LAB — I-STAT TROPONIN, ED: Troponin i, poc: 0 ng/mL (ref 0.00–0.08)

## 2014-06-03 MED ORDER — TRAMADOL HCL 50 MG PO TABS: 50.0000 mg | ORAL_TABLET | Freq: Four times a day (QID) | ORAL | Status: AC | PRN

## 2014-06-03 MED ORDER — ONDANSETRON HCL 4 MG/2ML IJ SOLN
4.0000 mg | Freq: Once | INTRAMUSCULAR | Status: AC
  Administered 2014-06-03: 4 mg via INTRAVENOUS
  Filled 2014-06-03: qty 2

## 2014-06-03 MED ORDER — MORPHINE SULFATE 4 MG/ML IJ SOLN
4.0000 mg | INTRAMUSCULAR | Status: DC | PRN
  Filled 2014-06-03: qty 1

## 2014-06-03 MED ORDER — HYDROMORPHONE HCL 1 MG/ML IJ SOLN
0.5000 mg | INTRAMUSCULAR | Status: DC | PRN
  Administered 2014-06-03: 0.5 mg via INTRAVENOUS
  Filled 2014-06-03: qty 1

## 2014-06-03 NOTE — ED Provider Notes (Signed)
CSN: [id number]     Arrival date & time 06/03/14  0056 History  This chart was scribed for George Porter, MD by Evon Slack, ED Scribe. This patient was seen in room A07C/A07C and the patient's care was started at 12:57 AM.      Chief Complaint  Patient presents with  . Loss of Consciousness   Patient is a 64 y.o. male presenting with syncope. The history is provided by the patient. No language interpreter was used.  Loss of Consciousness Associated symptoms: chest pain and headaches   Associated symptoms: no confusion, no diaphoresis, no dizziness, no fever, no nausea, no shortness of breath and no vomiting    HPI Comments: [name] is a 64 y.o. male who presents to the Emergency Department complaining of syncopal episode onset tonight just PTA. Pt states he has associated HA and neck pain but has a Hx of neck fusion. Pt states he has worsening sharp throbbing central CP. Pt states that he has had similar pain before that was relieved with nitroglycerin. Pt doesn't report any other symptoms.       Past Medical History  Diagnosis Date  . Hepatitis B   . Gastritis   . Stroke    Past Surgical History  Procedure Laterality Date  . Joint replacement    . Back surgery     History reviewed. No pertinent family history. History  Substance Use Topics  . Smoking status: Current Every Day Smoker -- 0.03 packs/day  . Smokeless tobacco: Not on file  . Alcohol Use: Yes     Comment: occasional    Review of Systems  Constitutional: Negative for fever, chills, diaphoresis, appetite change and fatigue.  HENT: Negative for mouth sores, sore throat and trouble swallowing.   Eyes: Negative for visual disturbance.  Respiratory: Negative for cough, chest tightness, shortness of breath and wheezing.   Cardiovascular: Positive for chest pain and syncope.  Gastrointestinal: Negative for nausea, vomiting, abdominal pain, diarrhea and abdominal distention.  Endocrine: Negative for polydipsia,  polyphagia and polyuria.  Genitourinary: Negative for dysuria, frequency and hematuria.  Musculoskeletal: Positive for neck pain. Negative for gait problem.  Skin: Negative for color change, pallor and rash.  Neurological: Positive for syncope and headaches. Negative for dizziness and light-headedness.  Hematological: Does not bruise/bleed easily.  Psychiatric/Behavioral: Negative for behavioral problems and confusion.     Allergies  Ibuprofen; Morphine and related; Naproxen; and Tylenol  Home Medications   Prior to Admission medications   Medication Sig Start Date End Date Taking? Authorizing Provider  aspirin 81 MG chewable tablet Chew 81 mg by mouth daily.   Yes Historical Provider, MD  cholecalciferol (VITAMIN D) 1000 UNITS tablet Take 1,000 Units by mouth daily.   Yes Historical Provider, MD  cyclobenzaprine (FLEXERIL) 10 MG tablet Take 10 mg by mouth 3 (three) times daily.   Yes Historical Provider, MD  hydrochlorothiazide (HYDRODIURIL) 25 MG tablet Take 25 mg by mouth daily.   Yes Historical Provider, MD  isosorbide mononitrate (IMDUR) 30 MG 24 hr tablet Take 30 mg by mouth daily.   Yes Historical Provider, MD  metoprolol tartrate (LOPRESSOR) 25 MG tablet Take 12.5 mg by mouth daily.   Yes Historical Provider, MD  omeprazole (PRILOSEC) 20 MG capsule Take 20 mg by mouth daily.   Yes Historical Provider, MD  traMADol (ULTRAM) 50 MG tablet Take 1 tablet (50 mg total) by mouth every 6 (six) hours as needed. 06/03/14   George Porter, MD   BP  145/79 mmHg  Pulse 53  Temp(Src) 98 F (36.7 C) (Oral)  Resp 13  Ht 5\' 11"  (1.803 m)  Wt 155 lb (70.308 kg)  BMI 21.63 kg/m2  SpO2 95%   Physical Exam  Constitutional: He is oriented to person, place, and time. He appears well-developed and well-nourished. No distress.  HENT:  Head: Normocephalic.  tenderness mid occiput  Eyes: Conjunctivae are normal. Pupils are equal, round, and reactive to light. No scleral icterus.  3 mm symmetric  reactive pupils  Neck: Normal range of motion. Neck supple. No thyromegaly present.  Diffuse tenderness midline neck tenderness   Cardiovascular: Normal rate and regular rhythm.  Exam reveals no gallop and no friction rub.   No murmur heard. Pulmonary/Chest: Effort normal and breath sounds normal. No respiratory distress. He has no wheezes. He has no rales.  Abdominal: Soft. Bowel sounds are normal. He exhibits no distension. There is no tenderness. There is no rebound.  Musculoskeletal: Normal range of motion.  Neurological: He is alert and oriented to person, place, and time.  Skin: Skin is warm and dry. No rash noted.  Psychiatric: He has a normal mood and affect. His behavior is normal.  Nursing note and vitals reviewed.   ED Course  Procedures (including critical care time) DIAGNOSTIC STUDIES: Oxygen Saturation is 95% on RA, adequate by my interpretation.    COORDINATION OF CARE: 1:12 AM-Discussed treatment plan with pt at bedside and pt agreed to plan.     Labs Review Labs Reviewed  BASIC METABOLIC PANEL - Abnormal; Notable for the following:    Potassium 3.2 (*)    GFR calc non Af Amer 60 (*)    GFR calc Af Amer 69 (*)    All other components within normal limits  CBC  I-STAT TROPOININ, ED    Imaging Review Ct Head Wo Contrast  06/03/2014   CLINICAL DATA:  Syncope. Hit head. Chest throbbing and shortness of breath. Concern for cervical spine injury. Initial encounter.  EXAM: CT HEAD WITHOUT CONTRAST  CT CERVICAL SPINE WITHOUT CONTRAST  TECHNIQUE: Multidetector CT imaging of the head and cervical spine was performed following the standard protocol without intravenous contrast. Multiplanar CT image reconstructions of the cervical spine were also generated.  COMPARISON:  None.  FINDINGS: CT HEAD FINDINGS  There is no evidence of acute infarction, mass lesion, or intra- or extra-axial hemorrhage on CT.  Prominence of the sulci suggests mild cortical volume loss. Mild  cerebellar atrophy is noted. Mild periventricular white matter change likely reflects small vessel ischemic microangiopathy.  The brainstem and fourth ventricle are within normal limits. The basal ganglia are unremarkable in appearance. The cerebral hemispheres demonstrate grossly normal gray-white differentiation. No mass effect or midline shift is seen.  There is no evidence of fracture; there is chronic deformity involving the mandibular condylar heads bilaterally, reflecting remote injury. The orbits are within normal limits. The paranasal sinuses and mastoid air cells are well-aerated. No significant soft tissue abnormalities are seen.  CT CERVICAL SPINE FINDINGS  There is no evidence of fracture or subluxation. Vertebral bodies demonstrate normal height and alignment. The patient is status post anterior cervical spinal fusion at C3-C6, with mild associated degenerative change. Intervertebral disc spaces are otherwise preserved. Prevertebral soft tissues are within normal limits.  The thyroid gland is unremarkable in appearance. The visualized lung apices are clear. No significant soft tissue abnormalities are seen.  IMPRESSION: 1. No evidence of traumatic intracranial injury or fracture. 2. No evidence of fracture or  subluxation along the cervical spine. 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 3. Mild cortical volume loss and scattered small vessel ischemic microangiopathy. 4. Chronic deformity involving the mandibular condylar heads bilaterally, reflecting remote injury.   Electronically Signed   By: Roanna Raider M.D.   On: 06/03/2014 01:56   Ct Cervical Spine Wo Contrast  06/03/2014   CLINICAL DATA:  Syncope. Hit head. Chest throbbing and shortness of breath. Concern for cervical spine injury. Initial encounter.  EXAM: CT HEAD WITHOUT CONTRAST  CT CERVICAL SPINE WITHOUT CONTRAST  TECHNIQUE: Multidetector CT imaging of the head and cervical spine was performed following the standard protocol  without intravenous contrast. Multiplanar CT image reconstructions of the cervical spine were also generated.  COMPARISON:  None.  FINDINGS: CT HEAD FINDINGS  There is no evidence of acute infarction, mass lesion, or intra- or extra-axial hemorrhage on CT.  Prominence of the sulci suggests mild cortical volume loss. Mild cerebellar atrophy is noted. Mild periventricular white matter change likely reflects small vessel ischemic microangiopathy.  The brainstem and fourth ventricle are within normal limits. The basal ganglia are unremarkable in appearance. The cerebral hemispheres demonstrate grossly normal gray-white differentiation. No mass effect or midline shift is seen.  There is no evidence of fracture; there is chronic deformity involving the mandibular condylar heads bilaterally, reflecting remote injury. The orbits are within normal limits. The paranasal sinuses and mastoid air cells are well-aerated. No significant soft tissue abnormalities are seen.  CT CERVICAL SPINE FINDINGS  There is no evidence of fracture or subluxation. Vertebral bodies demonstrate normal height and alignment. The patient is status post anterior cervical spinal fusion at C3-C6, with mild associated degenerative change. Intervertebral disc spaces are otherwise preserved. Prevertebral soft tissues are within normal limits.  The thyroid gland is unremarkable in appearance. The visualized lung apices are clear. No significant soft tissue abnormalities are seen.  IMPRESSION: 1. No evidence of traumatic intracranial injury or fracture. 2. No evidence of fracture or subluxation along the cervical spine. 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 3. Mild cortical volume loss and scattered small vessel ischemic microangiopathy. 4. Chronic deformity involving the mandibular condylar heads bilaterally, reflecting remote injury.   Electronically Signed   By: Roanna Raider M.D.   On: 06/03/2014 01:56     EKG  Interpretation   Date/Time:  Tuesday June 03 2014 00:57:57 EST Ventricular Rate:  81 PR Interval:    QRS Duration: 88 QT Interval:  418 QTC Calculation: 485 R Axis:   -38 Text Interpretation:  Normal sinus rhythm Left axis deviation -Noted  previously No significant change vs old ekg Confirmed by Fayrene Fearing  MD, Deryk Bozman  [phone]) on 06/03/2014 1:27:05 AM      MDM   Final diagnoses:  Fall  Syncope, vasovagal    Pt with improved headache.  Declines to stay for additional troponin testing.  Denies CP.  I personally performed the services described in this documentation, which was scribed in my presence. The recorded information has been reviewed and is accurate.      George Porter, MD 06/03/14 [phone]

## 2014-06-03 NOTE — Discharge Instructions (Signed)
Vasovagal Syncope, Adult °Syncope, commonly known as fainting, is a temporary loss of consciousness. It occurs when the blood flow to the brain is reduced. Vasovagal syncope (also called neurocardiogenic syncope) is a fainting spell in which the blood flow to the brain is reduced because of a sudden drop in heart rate and blood pressure. Vasovagal syncope occurs when the brain and the cardiovascular system (blood vessels) do not adequately communicate and respond to each other. This is the most common cause of fainting. It often occurs in response to fear or some other type of emotional or physical stress. The body has a reaction in which the heart starts beating too slowly or the blood vessels expand, reducing blood pressure. This type of fainting spell is generally considered harmless. However, injuries can occur if a person takes a sudden fall during a fainting spell.  °CAUSES  °Vasovagal syncope occurs when a person's blood pressure and heart rate decrease suddenly, usually in response to a trigger. Many things and situations can trigger an episode. Some of these include:  °· Pain.   °· Fear.   °· The sight of blood or medical procedures, such as blood being drawn from a vein.   °· Common activities, such as coughing, swallowing, stretching, or going to the bathroom.   °· Emotional stress.   °· Prolonged standing, especially in a warm environment.   °· Lack of sleep or rest.   °· Prolonged lack of food.   °· Prolonged lack of fluids.   °· Recent illness. °· The use of certain drugs that affect blood pressure, such as cocaine, alcohol, marijuana, inhalants, and opiates.   °SYMPTOMS  °Before the fainting episode, you may:  °· Feel dizzy or light headed.   °· Become pale. °· Sense that you are going to faint.   °· Feel like the room is spinning.   °· Have tunnel vision, only seeing directly in front of you.   °· Feel sick to your stomach (nauseous).   °· See spots or slowly lose vision.   °· Hear ringing in your  ears.   °· Have a headache.   °· Feel warm and sweaty.   °· Feel a sensation of pins and needles. °During the fainting spell, you will generally be unconscious for no longer than a couple minutes before waking up and returning to normal. If you get up too quickly before your body can recover, you may faint again. Some twitching or jerky movements may occur during the fainting spell.  °DIAGNOSIS  °Your caregiver will ask about your symptoms, take a medical history, and perform a physical exam. Various tests may be done to rule out other causes of fainting. These may include blood tests and tests to check the heart, such as electrocardiography, echocardiography, and possibly an electrophysiology study. When other causes have been ruled out, a test may be done to check the body's response to changes in position (tilt table test). °TREATMENT  °Most cases of vasovagal syncope do not require treatment. Your caregiver may recommend ways to avoid fainting triggers and may provide home strategies for preventing fainting. If you must be exposed to a possible trigger, you can drink additional fluids to help reduce your chances of having an episode of vasovagal syncope. If you have warning signs of an oncoming episode, you can respond by positioning yourself favorably (lying down). °If your fainting spells continue, you may be given medicines to prevent fainting. Some medicines may help make you more resistant to repeated episodes of vasovagal syncope. Special exercises or compression stockings may be recommended. In rare cases, the surgical placement   of a pacemaker is considered. °HOME CARE INSTRUCTIONS  °· Learn to identify the warning signs of vasovagal syncope.   °· Sit or lie down at the first warning sign of a fainting spell. If sitting, put your head down between your legs. If you lie down, swing your legs up in the air to increase blood flow to the brain.   °· Avoid hot tubs and saunas. °· Avoid prolonged  standing. °· Drink enough fluids to keep your urine clear or pale yellow. Avoid caffeine. °· Increase salt in your diet as directed by your caregiver.   °· If you have to stand for a long time, perform movements such as:   °¨ Crossing your legs.   °¨ Flexing and stretching your leg muscles.   °¨ Squatting.   °¨ Moving your legs.   °¨ Bending over.   °· Only take over-the-counter or prescription medicines as directed by your caregiver. Do not suddenly stop any medicines without asking your caregiver first.  °SEEK MEDICAL CARE IF:  °· Your fainting spells continue or happen more frequently in spite of treatment.   °· You lose consciousness for more than a couple minutes. °· You have fainting spells during or after exercising or after being startled.   °· You have new symptoms that occur with the fainting spells, such as:   °¨ Shortness of breath. °¨ Chest pain.   °¨ Irregular heartbeat.   °· You have episodes of twitching or jerky movements that last longer than a few seconds. °· You have episodes of twitching or jerky movements without obvious fainting. °SEEK IMMEDIATE MEDICAL CARE IF:  °· You have injuries or bleeding after a fainting spell.   °· You have episodes of twitching or jerky movements that last longer than 5 minutes.   °· You have more than one spell of twitching or jerky movements before returning to consciousness after fainting. °MAKE SURE YOU:  °· Understand these instructions. °· Will watch your condition. °· Will get help right away if you are not doing well or get worse. °Document Released: 04/04/2012 Document Reviewed: 04/04/2012 °[employer]® Patient Information ©2015 [employer]. This information is not intended to replace advice given to you by your health care provider. Make sure you discuss any questions you have with your health care provider. ° °

## 2014-06-03 NOTE — ED Notes (Signed)
Pt. Left with all belongings 

## 2014-06-03 NOTE — ED Notes (Signed)
[name] [phone]. Call for updates

## 2014-06-03 NOTE — ED Notes (Signed)
Pt son of patient in Trauma B, he came into the family conference room with his family holding his chest and family states he passed out and hit his head. He is shaking and upset. Endorses sharp throbbing in chest and SOB.

## 2014-12-11 ENCOUNTER — Emergency Department (HOSPITAL_COMMUNITY): Payer: Medicare Other

## 2014-12-11 ENCOUNTER — Observation Stay (HOSPITAL_COMMUNITY)
Admission: EM | Admit: 2014-12-11 | Discharge: 2014-12-12 | Disposition: A | Discharge status: Home / Self Care | Payer: Medicare Other | Attending: Internal Medicine | Admitting: Internal Medicine

## 2014-12-11 ENCOUNTER — Encounter (HOSPITAL_COMMUNITY): Payer: Self-pay | Admitting: *Deleted

## 2014-12-11 DIAGNOSIS — B1910 Unspecified viral hepatitis B without hepatic coma: Secondary | ICD-10-CM | POA: Insufficient documentation

## 2014-12-11 DIAGNOSIS — R1013 Epigastric pain: Secondary | ICD-10-CM | POA: Insufficient documentation

## 2014-12-11 DIAGNOSIS — R0789 Other chest pain: Secondary | ICD-10-CM | POA: Diagnosis not present

## 2014-12-11 DIAGNOSIS — I10 Essential (primary) hypertension: Secondary | ICD-10-CM | POA: Diagnosis not present

## 2014-12-11 DIAGNOSIS — R55 Syncope and collapse: Secondary | ICD-10-CM | POA: Diagnosis not present

## 2014-12-11 DIAGNOSIS — Y903 Blood alcohol level of 60-79 mg/100 ml: Secondary | ICD-10-CM | POA: Diagnosis not present

## 2014-12-11 DIAGNOSIS — R0602 Shortness of breath: Secondary | ICD-10-CM | POA: Diagnosis not present

## 2014-12-11 DIAGNOSIS — Z885 Allergy status to narcotic agent status: Secondary | ICD-10-CM | POA: Insufficient documentation

## 2014-12-11 DIAGNOSIS — R52 Pain, unspecified: Secondary | ICD-10-CM

## 2014-12-11 DIAGNOSIS — Z8673 Personal history of transient ischemic attack (TIA), and cerebral infarction without residual deficits: Secondary | ICD-10-CM | POA: Diagnosis not present

## 2014-12-11 DIAGNOSIS — Z6821 Body mass index (BMI) 21.0-21.9, adult: Secondary | ICD-10-CM | POA: Insufficient documentation

## 2014-12-11 DIAGNOSIS — N179 Acute kidney failure, unspecified: Secondary | ICD-10-CM | POA: Diagnosis present

## 2014-12-11 DIAGNOSIS — Z79899 Other long term (current) drug therapy: Secondary | ICD-10-CM | POA: Diagnosis not present

## 2014-12-11 DIAGNOSIS — Z886 Allergy status to analgesic agent status: Secondary | ICD-10-CM | POA: Diagnosis not present

## 2014-12-11 DIAGNOSIS — F1410 Cocaine abuse, uncomplicated: Secondary | ICD-10-CM | POA: Diagnosis present

## 2014-12-11 DIAGNOSIS — F1020 Alcohol dependence, uncomplicated: Secondary | ICD-10-CM | POA: Insufficient documentation

## 2014-12-11 DIAGNOSIS — Z7982 Long term (current) use of aspirin: Secondary | ICD-10-CM | POA: Diagnosis not present

## 2014-12-11 DIAGNOSIS — R079 Chest pain, unspecified: Secondary | ICD-10-CM | POA: Diagnosis present

## 2014-12-11 DIAGNOSIS — F17210 Nicotine dependence, cigarettes, uncomplicated: Secondary | ICD-10-CM | POA: Diagnosis not present

## 2014-12-11 DIAGNOSIS — K921 Melena: Secondary | ICD-10-CM | POA: Insufficient documentation

## 2014-12-11 DIAGNOSIS — E43 Unspecified severe protein-calorie malnutrition: Secondary | ICD-10-CM | POA: Insufficient documentation

## 2014-12-11 LAB — CBC WITH DIFFERENTIAL/PLATELET
Basophils Absolute: 0 10*3/uL (ref 0.0–0.1)
Basophils Relative: 0 % (ref 0–1)
EOS ABS: 0.1 10*3/uL (ref 0.0–0.7)
Eosinophils Relative: 1 % (ref 0–5)
HCT: 41.3 % (ref 39.0–52.0)
Hemoglobin: 14.7 g/dL (ref 13.0–17.0)
LYMPHS ABS: 2.9 10*3/uL (ref 0.7–4.0)
Lymphocytes Relative: 45 % (ref 12–46)
MCH: 32.8 pg (ref 26.0–34.0)
MCHC: 35.6 g/dL (ref 30.0–36.0)
MCV: 92.2 fL (ref 78.0–100.0)
Monocytes Absolute: 0.3 10*3/uL (ref 0.1–1.0)
Monocytes Relative: 4 % (ref 3–12)
NEUTROS PCT: 50 % (ref 43–77)
Neutro Abs: 3.2 10*3/uL (ref 1.7–7.7)
PLATELETS: 200 10*3/uL (ref 150–400)
RBC: 4.48 MIL/uL (ref 4.22–5.81)
RDW: 13.1 % (ref 11.5–15.5)
WBC: 6.4 10*3/uL (ref 4.0–10.5)

## 2014-12-11 LAB — COMPREHENSIVE METABOLIC PANEL
ALT: 53 U/L (ref 17–63)
ANION GAP: 15 (ref 5–15)
AST: 49 U/L — ABNORMAL HIGH (ref 15–41)
Albumin: 4.4 g/dL (ref 3.5–5.0)
Alkaline Phosphatase: 44 U/L (ref 38–126)
BUN: 20 mg/dL (ref 6–20)
CALCIUM: 9.8 mg/dL (ref 8.9–10.3)
CO2: 23 mmol/L (ref 22–32)
Chloride: 99 mmol/L — ABNORMAL LOW (ref 101–111)
Creatinine, Ser: 1.41 mg/dL — ABNORMAL HIGH (ref 0.61–1.24)
GFR, EST NON AFRICAN AMERICAN: 52 mL/min — AB (ref >60)
Glucose, Bld: 90 mg/dL (ref 65–99)
POTASSIUM: 3.6 mmol/L (ref 3.5–5.1)
SODIUM: 137 mmol/L (ref 135–145)
TOTAL PROTEIN: 7.8 g/dL (ref 6.5–8.1)
Total Bilirubin: 1.3 mg/dL — ABNORMAL HIGH (ref 0.3–1.2)

## 2014-12-11 LAB — URINALYSIS, ROUTINE W REFLEX MICROSCOPIC
BILIRUBIN URINE: NEGATIVE
Glucose, UA: NEGATIVE mg/dL
HGB URINE DIPSTICK: NEGATIVE
KETONES UR: NEGATIVE mg/dL
Leukocytes, UA: NEGATIVE
Nitrite: NEGATIVE
Protein, ur: NEGATIVE mg/dL
Specific Gravity, Urine: 1.011 (ref 1.005–1.030)
UROBILINOGEN UA: 0.2 mg/dL (ref 0.0–1.0)
pH: 5 (ref 5.0–8.0)

## 2014-12-11 LAB — I-STAT TROPONIN, ED
TROPONIN I, POC: 0 ng/mL (ref 0.00–0.08)
Troponin i, poc: 0 ng/mL (ref 0.00–0.08)

## 2014-12-11 LAB — CBC
HCT: 37.2 % — ABNORMAL LOW (ref 39.0–52.0)
HEMOGLOBIN: 12.8 g/dL — AB (ref 13.0–17.0)
MCH: 31.8 pg (ref 26.0–34.0)
MCHC: 34.4 g/dL (ref 30.0–36.0)
MCV: 92.5 fL (ref 78.0–100.0)
Platelets: 153 10*3/uL (ref 150–400)
RBC: 4.02 MIL/uL — AB (ref 4.22–5.81)
RDW: 13.2 % (ref 11.5–15.5)
WBC: 6.3 10*3/uL (ref 4.0–10.5)

## 2014-12-11 LAB — TROPONIN I: Troponin I: <0.03 ng/mL (ref <0.031)

## 2014-12-11 LAB — CBG MONITORING, ED: Glucose-Capillary: 100 mg/dL — ABNORMAL HIGH (ref 65–99)

## 2014-12-11 LAB — I-STAT CG4 LACTIC ACID, ED: Lactic Acid, Venous: 2.92 mmol/L (ref 0.5–2.0)

## 2014-12-11 LAB — CREATININE, SERUM
CREATININE: 1.15 mg/dL (ref 0.61–1.24)
GFR calc Af Amer: >60 mL/min (ref >60)
GFR calc non Af Amer: >60 mL/min (ref >60)

## 2014-12-11 LAB — RAPID URINE DRUG SCREEN, HOSP PERFORMED
AMPHETAMINES: NOT DETECTED
Barbiturates: NOT DETECTED
Benzodiazepines: NOT DETECTED
Cocaine: POSITIVE — AB
Opiates: NOT DETECTED
TETRAHYDROCANNABINOL: POSITIVE — AB

## 2014-12-11 LAB — PROTIME-INR
INR: 1.06 (ref 0.00–1.49)
Prothrombin Time: 14 seconds (ref 11.6–15.2)

## 2014-12-11 LAB — ETHANOL: ALCOHOL ETHYL (B): 73 mg/dL — AB (ref <5)

## 2014-12-11 LAB — LIPASE, BLOOD: Lipase: 47 U/L (ref 22–51)

## 2014-12-11 MED ORDER — ENOXAPARIN SODIUM 40 MG/0.4ML ~~LOC~~ SOLN
40.0000 mg | SUBCUTANEOUS | Status: DC
  Administered 2014-12-11: 40 mg via SUBCUTANEOUS
  Filled 2014-12-11: qty 0.4

## 2014-12-11 MED ORDER — ACETAMINOPHEN 650 MG RE SUPP: 650.0000 mg | Freq: Four times a day (QID) | RECTAL | Status: DC | PRN

## 2014-12-11 MED ORDER — IOHEXOL 350 MG/ML SOLN
100.0000 mL | Freq: Once | INTRAVENOUS | Status: AC | PRN
  Administered 2014-12-11: 100 mL via INTRAVENOUS

## 2014-12-11 MED ORDER — LORAZEPAM 1 MG PO TABS: 0.0000 mg | ORAL_TABLET | Freq: Two times a day (BID) | ORAL | Status: DC

## 2014-12-11 MED ORDER — ONDANSETRON HCL 4 MG/2ML IJ SOLN: 4.0000 mg | Freq: Four times a day (QID) | INTRAMUSCULAR | Status: DC | PRN

## 2014-12-11 MED ORDER — VITAMIN B-1 100 MG PO TABS
100.0000 mg | ORAL_TABLET | Freq: Every day | ORAL | Status: DC
  Administered 2014-12-11 – 2014-12-12 (×2): 100 mg via ORAL
  Filled 2014-12-11 (×2): qty 1

## 2014-12-11 MED ORDER — ALUM & MAG HYDROXIDE-SIMETH 200-200-20 MG/5ML PO SUSP: 30.0000 mL | Freq: Four times a day (QID) | ORAL | Status: DC | PRN

## 2014-12-11 MED ORDER — FENTANYL CITRATE (PF) 100 MCG/2ML IJ SOLN
50.0000 ug | Freq: Once | INTRAMUSCULAR | Status: AC
  Administered 2014-12-11: 50 ug via INTRAVENOUS
  Filled 2014-12-11: qty 2

## 2014-12-11 MED ORDER — ACETAMINOPHEN 325 MG PO TABS: 650.0000 mg | ORAL_TABLET | Freq: Four times a day (QID) | ORAL | Status: DC | PRN

## 2014-12-11 MED ORDER — ONDANSETRON HCL 4 MG PO TABS: 4.0000 mg | ORAL_TABLET | Freq: Four times a day (QID) | ORAL | Status: DC | PRN

## 2014-12-11 MED ORDER — GI COCKTAIL ~~LOC~~
30.0000 mL | Freq: Once | ORAL | Status: DC
  Filled 2014-12-11: qty 30

## 2014-12-11 MED ORDER — SODIUM CHLORIDE 0.9 % IJ SOLN: 3.0000 mL | Freq: Two times a day (BID) | INTRAMUSCULAR | Status: DC

## 2014-12-11 MED ORDER — FOLIC ACID 1 MG PO TABS
1.0000 mg | ORAL_TABLET | Freq: Every day | ORAL | Status: DC
  Administered 2014-12-11 – 2014-12-12 (×2): 1 mg via ORAL
  Filled 2014-12-11 (×2): qty 1

## 2014-12-11 MED ORDER — ISOSORBIDE MONONITRATE ER 30 MG PO TB24
30.0000 mg | ORAL_TABLET | Freq: Every day | ORAL | Status: DC
  Administered 2014-12-12: 30 mg via ORAL
  Filled 2014-12-11: qty 1

## 2014-12-11 MED ORDER — SODIUM CHLORIDE 0.9 % IV SOLN
INTRAVENOUS | Status: DC
  Administered 2014-12-11: 15:00:00 via INTRAVENOUS

## 2014-12-11 MED ORDER — LORAZEPAM 1 MG PO TABS: 1.0000 mg | ORAL_TABLET | Freq: Four times a day (QID) | ORAL | Status: DC | PRN

## 2014-12-11 MED ORDER — PANTOPRAZOLE SODIUM 40 MG PO TBEC
40.0000 mg | DELAYED_RELEASE_TABLET | Freq: Two times a day (BID) | ORAL | Status: DC
  Administered 2014-12-11 – 2014-12-12 (×2): 40 mg via ORAL
  Filled 2014-12-11 (×2): qty 1

## 2014-12-11 MED ORDER — LORAZEPAM 1 MG PO TABS: 0.0000 mg | ORAL_TABLET | Freq: Four times a day (QID) | ORAL | Status: DC

## 2014-12-11 MED ORDER — ASPIRIN 81 MG PO CHEW
81.0000 mg | CHEWABLE_TABLET | Freq: Every day | ORAL | Status: DC
  Administered 2014-12-12: 81 mg via ORAL
  Filled 2014-12-11: qty 1

## 2014-12-11 MED ORDER — PANTOPRAZOLE SODIUM 40 MG IV SOLR
40.0000 mg | Freq: Once | INTRAVENOUS | Status: AC
  Administered 2014-12-11: 40 mg via INTRAVENOUS
  Filled 2014-12-11: qty 40

## 2014-12-11 MED ORDER — ADULT MULTIVITAMIN W/MINERALS CH
1.0000 | ORAL_TABLET | Freq: Every day | ORAL | Status: DC
  Administered 2014-12-11 – 2014-12-12 (×2): 1 via ORAL
  Filled 2014-12-11 (×2): qty 1

## 2014-12-11 MED ORDER — LORAZEPAM 2 MG/ML IJ SOLN: 1.0000 mg | Freq: Four times a day (QID) | INTRAMUSCULAR | Status: DC | PRN

## 2014-12-11 MED ORDER — THIAMINE HCL 100 MG/ML IJ SOLN: 100.0000 mg | Freq: Every day | INTRAMUSCULAR | Status: DC

## 2014-12-11 MED ORDER — SODIUM CHLORIDE 0.9 % IV BOLUS (SEPSIS)
1000.0000 mL | Freq: Once | INTRAVENOUS | Status: AC
  Administered 2014-12-11: 1000 mL via INTRAVENOUS

## 2014-12-11 NOTE — ED Notes (Signed)
This am began having chest pain, epigastric abdominal pain, lightheadedness and shortness of breath 'all at the same time.' Drank half can of beer this am. Syncope at work [employer], then in lobby as friends brought him into ED

## 2014-12-11 NOTE — ED Notes (Signed)
Lactic acid results given to Dr. Rees 

## 2014-12-11 NOTE — ED Notes (Signed)
CBG 100 RN Erin notified, Tech Gerilyn Pilgrim notified

## 2014-12-11 NOTE — H&P (Signed)
[facility] History and Physical  [name] ZOX:[id number] DOB: 12/12/1950 DOA: 12/11/2014  Referring physician:  PCP: Pcp Not In System   Chief Complaint: Chest pain/syncope  HPI: [name] is a 64 y.o. male with a past medical history of polysubstance abuse including crack cocaine and alcohol abuse presented to the emergency department with complaints of chest pain and passing out. He reports having chest pain located in the retrosternal region intermittently for the past 3 weeks. He states stress pain can occur at any time without any precipitating factors. Today he reported trimming his hedges and had a syncopal event. He denied dizziness, lightheadedness, palpitations, focal neurological deficits prior to event. He reported consuming alcohol this morning. Labs revealed an alcohol level of 73 with urine drug screen positive for cocaine and marijuana. He reports last smoking crack several days ago. EKG did not show acute ischemic changes, troponins were negative.                                                                        Review of Systems:  Constitutional:  No weight loss, night sweats, Fevers, chills, fatigue.  HEENT:  No headaches, Difficulty swallowing,Tooth/dental problems,Sore throat,  No sneezing, itching, ear ache, nasal congestion, post nasal drip,  Cardio-vascular:  Positive for chest pain, Orthopnea, PND, swelling in lower extremities, anasarca, dizziness, palpitations  GI:  No heartburn, indigestion, positive for abdominal pain, nausea, vomiting, diarrhea, change in bowel habits, loss of appetite  Resp:  No shortness of breath with exertion or at rest. No excess mucus, no productive cough, No non-productive cough, No coughing up of blood.No change in color of mucus.No wheezing.No chest wall deformity  Skin:  no rash or lesions.  GU:  no dysuria, change in color of urine, no urgency or frequency. No flank pain.  Musculoskeletal:  No joint pain  or swelling. No decreased range of motion. No back pain.  Psych:  No change in mood or affect. No depression or anxiety. No memory loss.   Past Medical History  Diagnosis Date  . Hepatitis B   . Gastritis   . Stroke    Past Surgical History  Procedure Laterality Date  . Joint replacement    . Back surgery    . Left ankle surgery     Social History:  reports that he has been smoking.  He has never used smokeless tobacco. He reports that he drinks alcohol. He reports that he uses illicit drugs (Cocaine and Marijuana).  Allergies  Allergen Reactions  . Ibuprofen Nausea Only  . Morphine And Related Hives and Itching  . Naproxen Nausea Only  . Tylenol [Acetaminophen] Nausea Only    In large quanities causes nausea.    History reviewed. No pertinent family history.   Prior to Admission medications   Medication Sig Start Date End Date Taking? Authorizing Provider  aspirin 81 MG chewable tablet Chew 81 mg by mouth daily.   Yes Historical Provider, MD  cholecalciferol (VITAMIN D) 1000 UNITS tablet Take 1,000 Units by mouth daily.   Yes Historical Provider, MD  cyclobenzaprine (FLEXERIL) 10 MG tablet Take 10 mg by mouth 3 (three) times daily.   Yes Historical Provider, MD  hydrochlorothiazide (HYDRODIURIL) 25 MG tablet  Take 25 mg by mouth daily.   Yes Historical Provider, MD  isosorbide mononitrate (IMDUR) 30 MG 24 hr tablet Take 30 mg by mouth daily.   Yes Historical Provider, MD  metoprolol tartrate (LOPRESSOR) 25 MG tablet Take 12.5 mg by mouth daily. Verified tartrate once daily   Yes Historical Provider, MD  omeprazole (PRILOSEC) 20 MG capsule Take 20 mg by mouth daily.   Yes Historical Provider, MD  traMADol (ULTRAM) 50 MG tablet Take 1 tablet (50 mg total) by mouth every 6 (six) hours as needed. Patient not taking: Reported on 12/11/2014 06/03/14   [name], MD   Physical Exam: Filed Vitals:   12/11/14 1238 12/11/14 1245 12/11/14 1330 12/11/14 1400  BP: 137/93 142/84 141/81  139/82  Pulse: 73 72 70 59  Temp: 98.7 F (37.1 C)     TempSrc: Oral     Resp: 20 15 13    SpO2: 98% 97% 99% 97%    Wt Readings from Last 3 Encounters:  06/03/14 70.308 kg (155 lb)    General:  Appears calm and comfortable, no acute distress, thin appearing Eyes: PERRL, normal lids, irises & conjunctiva ENT: grossly normal hearing, lips & tongue, dry oral mucosa Neck: no LAD, masses or thyromegaly Cardiovascular: RRR, no m/r/g. No LE edema. Telemetry: SR, no arrhythmias  Respiratory: CTA bilaterally, no w/r/r. Normal respiratory effort. Abdomen: soft, ntnd Skin: no rash or induration seen on limited exam Musculoskeletal: grossly normal tone BUE/BLE, bilateral muscle atrophy Psychiatric: grossly normal mood and affect, speech fluent and appropriate Neurologic: grossly non-focal.          Labs on Admission:  Basic Metabolic Panel:  Recent Labs Lab 12/11/14 1040  NA 137  K 3.6  CL 99*  CO2 23  GLUCOSE 90  BUN 20  CREATININE 1.41*  CALCIUM 9.8   Liver Function Tests:  Recent Labs Lab 12/11/14 1040  AST 49*  ALT 53  ALKPHOS 44  BILITOT 1.3*  PROT 7.8  ALBUMIN 4.4    Recent Labs Lab 12/11/14 1040  LIPASE 47   No results for input(s): AMMONIA in the last 168 hours. CBC:  Recent Labs Lab 12/11/14 1040  WBC 6.4  NEUTROABS 3.2  HGB 14.7  HCT 41.3  MCV 92.2  PLT 200   Cardiac Enzymes: No results for input(s): CKTOTAL, CKMB, CKMBINDEX, TROPONINI in the last 168 hours.  BNP (last 3 results) No results for input(s): BNP in the last 8760 hours.  ProBNP (last 3 results) No results for input(s): PROBNP in the last 8760 hours.  CBG:  Recent Labs Lab 12/11/14 1027  GLUCAP 100*    Radiological Exams on Admission: Ct Angio Chest Pe W/cm &/or Wo Cm  12/11/2014   CLINICAL DATA:  Acute chest pain, shortness of breath.  EXAM: CT ANGIOGRAPHY CHEST WITH CONTRAST  TECHNIQUE: Multidetector CT imaging of the chest was performed using the standard protocol  during bolus administration of intravenous contrast. Multiplanar CT image reconstructions and MIPs were obtained to evaluate the vascular anatomy.  CONTRAST:  OMNIPAQUE IOHEXOL 350 MG/ML SOLN  COMPARISON:  CT scan of November 21, 2011.  FINDINGS: No pneumothorax or pleural effusion is noted. No acute pulmonary disease is noted. Stable 3 mm nodule is noted anteriorly in the left upper lobe. This can be considered benign at this point with no further follow-up required. There is no evidence of pulmonary embolus. There is no evidence of thoracic aortic dissection or aneurysm. Visualized portion of upper abdomen appears normal. No  mediastinal mass or adenopathy is noted. No significant osseous abnormality is noted.  Review of the MIP images confirms the above findings.  IMPRESSION: No evidence of pulmonary embolus. No acute cardiopulmonary abnormality seen.   Electronically Signed   By: Lupita Raider, M.D.   On: 12/11/2014 13:53   Ct Abdomen Pelvis W Contrast  12/11/2014   CLINICAL DATA:  Chest pain, epigastric pain, abdominal pain, shortness of Breath  EXAM: CT ABDOMEN AND PELVIS WITH CONTRAST  TECHNIQUE: Multidetector CT imaging of the abdomen and pelvis was performed using the standard protocol following bolus administration of intravenous contrast.  CONTRAST:  OMNIPAQUE IOHEXOL 350 MG/ML SOLN  COMPARISON:  None.  FINDINGS: Lung bases are unremarkable. Enhanced liver shows no focal mass. No calcified gallstones are noted within gallbladder.  Atherosclerotic calcifications and celiac trunk origin. There is no abdominal aortic aneurysm. Atherosclerotic calcifications of distal abdominal aorta. No periaortic leak.  The pancreas, spleen and adrenal glands are unremarkable.  Kidneys are symmetrical in size and enhancement. No hydronephrosis or hydroureter.  Delayed renal images shows bilateral renal symmetrical excretion. Bilateral visualized proximal ureter is unremarkable.  There is no small bowel  obstruction. No ascites or free air. No adenopathy.  No thickened or dilated small bowel loops are noted. Normal appendix is clearly visualized in coronal image 37. There is no pericecal inflammation.  IMPRESSION: 1. There is no evidence of inflammatory process within abdomen. 2. No hydronephrosis or hydroureter. Bilateral renal symmetrical excretion. 3. Normal appendix.  No pericecal inflammation. 4. There is thickening of urinary bladder wall. Clinical correlation is necessary to exclude cystitis, chronic inflammation or neoplastic process. Correlation with urology exam is recommended.   Electronically Signed   By: Natasha Mead M.D.   On: 12/11/2014 13:48   Dg Chest Port 1 View  12/11/2014   CLINICAL DATA:  Chest pain.  EXAM: PORTABLE CHEST - 1 VIEW  COMPARISON:  CT 11/21/2011.  FINDINGS: Mediastinum and hilar structures normal. Lungs are clear. Heart size normal. Calcified pulmonary nodule in the right mid lung field consistent granuloma. Degenerative changes noted both shoulders. Degenerative changes thoracic spine.  IMPRESSION: No acute cardiopulmonary disease. Calcified pulmonary nodule right mid lung consistent with granuloma.   Electronically Signed   By: Maisie Fus  Register   On: 12/11/2014 12:10    EKG: Independently reviewed.   Assessment/Plan Principal Problem:   Cocaine abuse Active Problems:   Chest pain   AKI (acute kidney injury)   Syncope   1. Chest pain. Patient presenting with chest pain having atypical features. Initial workup included an EKG that did not show acute ischemic changes. History point was negative 2 sets in the ER. They gets possible VATS chest pain may be resulting from coronary artery  vasospasm in setting of cocaine abuse. Other possibilities could include gastroesophageal reflux disease or gastritis with his history of alcohol abuse. Plan to cycle troponins overnight and obtain a transthoracic echocardiogram in morning. Place him in overnight observation with  continuous cardiac monitoring. 2. Syncope. He reported having a syncopal event while trimming his hedges today. Could be related to dehydration/hypovolemia given the presence of acute kidney injury and polysubstance abuse. He reported drinking beer this morning. Placing patient on continuous cardiac monitoring to assess for cardiac arrhythmias. No further workup with transthoracic echocardiogram 3. Hypertension. Will continue Imdur 30 mg by mouth daily. I will discontinue metoprolol given the possibility of unopposed alpha adrenergic stimulation. Could add calcium channel blocker if blood pressures for better blood pressure control.  4. Acute kidney injury. Likely secondary to hypovolemia and dehydration. Patient with history of chronic alcoholism appears malnourished on exam. Will provide IV fluid resuscitation with normal saline running at 100 mL/hour, repeat labs in a.m. 5. Severe protein calorie malnutrition. Right protein boost. Likely consequence of alcoholism 6. Alcohol abuse. Will place patient on CIWA protocol   7. Cocaine abuse. Patient was counseled on cessation of cocaine, he didn't seem interested in quitting.   Code Status: Full code Family Communication: Family not present Disposition Plan: Lace patient in overnight observation do not anticipate him requiring greater than 2 night hospitalization  Time spent: 55 min  Jeralyn Bennett [facility] Pager [phone]

## 2014-12-11 NOTE — ED Provider Notes (Signed)
CSN: [id number]     Arrival date & time 12/11/14  1027 History   First MD Initiated Contact with Patient 12/11/14 1031     Chief Complaint  Patient presents with  . Loss of Consciousness  . Chest Pain  . Abdominal Pain     Patient is a 64 y.o. male presenting with syncope, chest pain, and abdominal pain. The history is provided by the patient. No language interpreter was used.  Loss of Consciousness Associated symptoms: chest pain   Chest Pain Associated symptoms: abdominal pain and syncope   Abdominal Pain Associated symptoms: chest pain    [name] presents for evaluation of chest pain, abdominal pain, syncope. Patient reports that he developed chest pain, abdominal pain, shortness of breath all about 30 minutes prior to ED arrival. Per report he passed out once at work and once in the ED waiting room. He states he has associated nausea and sweating. The pain feels like it is diffuse and it is dull/pressure-like in nature. He has a history of problems, hypertension, hyperlipidemia. No history of clots or cardiac disease.  Past Medical History  Diagnosis Date  . Hepatitis B   . Gastritis   . Stroke    Past Surgical History  Procedure Laterality Date  . Joint replacement    . Back surgery     No family history on file. Social History  Substance Use Topics  . Smoking status: Current Every Day Smoker -- 0.03 packs/day  . Smokeless tobacco: Not on file  . Alcohol Use: Yes     Comment: occasional    Review of Systems  Cardiovascular: Positive for chest pain and syncope.  Gastrointestinal: Positive for abdominal pain.  All other systems reviewed and are negative.     Allergies  Ibuprofen; Morphine and related; Naproxen; and Tylenol  Home Medications   Prior to Admission medications   Medication Sig Start Date End Date Taking? Authorizing Provider  aspirin 81 MG chewable tablet Chew 81 mg by mouth daily.    Historical Provider, MD  cholecalciferol (VITAMIN D) 1000  UNITS tablet Take 1,000 Units by mouth daily.    Historical Provider, MD  cyclobenzaprine (FLEXERIL) 10 MG tablet Take 10 mg by mouth 3 (three) times daily.    Historical Provider, MD  hydrochlorothiazide (HYDRODIURIL) 25 MG tablet Take 25 mg by mouth daily.    Historical Provider, MD  isosorbide mononitrate (IMDUR) 30 MG 24 hr tablet Take 30 mg by mouth daily.    Historical Provider, MD  metoprolol tartrate (LOPRESSOR) 25 MG tablet Take 12.5 mg by mouth daily.    Historical Provider, MD  omeprazole (PRILOSEC) 20 MG capsule Take 20 mg by mouth daily.    Historical Provider, MD  traMADol (ULTRAM) 50 MG tablet Take 1 tablet (50 mg total) by mouth every 6 (six) hours as needed. 06/03/14   [name], MD   BP 136/85 mmHg  Pulse 82  Temp(Src) 98.7 F (37.1 C) (Oral)  Resp 20  SpO2 96% Physical Exam  Constitutional: He is oriented to person, place, and time. He appears well-developed and well-nourished.  HENT:  Head: Normocephalic and atraumatic.  Cardiovascular: Normal rate and regular rhythm.   No murmur heard. Pulmonary/Chest: Effort normal and breath sounds normal. No respiratory distress.  Abdominal: Soft. There is no rebound and no guarding.  Moderate diffuse abdominal tenderness  Musculoskeletal: He exhibits no edema or tenderness.  2+ pulses in all 4 extremities  Neurological: He is alert and oriented to person, place,  and time.  Skin: Skin is warm and dry.  Psychiatric:  Poor eye contact  Nursing note and vitals reviewed.   ED Course  Procedures (including critical care time) EMERGENCY DEPARTMENT [place] ABD/AORTA EXAM Study: Limited Ultrasound of the Abdominal Aorta.  INDICATIONS:Abdominal pain Indication: Multiple views of the abdominal aorta are obtained from the diaphragmatic hiatus to the aortic bifurcation in transverse and sagittal planes with a multi- Frequency probe.  PERFORMED BY: Myself  IMAGES ARCHIVED?: Yes  FINDINGS: Maximum aortic dimensions are 2.2 cmx  1.9cm  LIMITATIONS:  Bowel gas  INTERPRETATION:  No abdominal aortic aneurysm and Abdominal free fluid absent  COMMENT:    Labs Review Labs Reviewed  COMPREHENSIVE METABOLIC PANEL - Abnormal; Notable for the following:    Chloride 99 (*)    Creatinine, Ser 1.41 (*)    AST 49 (*)    Total Bilirubin 1.3 (*)    GFR calc non Af Amer 52 (*)    All other components within normal limits  ETHANOL - Abnormal; Notable for the following:    Alcohol, Ethyl (B) 73 (*)    All other components within normal limits  URINE RAPID DRUG SCREEN, HOSP PERFORMED - Abnormal; Notable for the following:    Cocaine POSITIVE (*)    Tetrahydrocannabinol POSITIVE (*)    All other components within normal limits  CBC - Abnormal; Notable for the following:    RBC 4.02 (*)    Hemoglobin 12.8 (*)    HCT 37.2 (*)    All other components within normal limits  BASIC METABOLIC PANEL - Abnormal; Notable for the following:    Potassium 3.3 (*)    Glucose, Bld 103 (*)    All other components within normal limits  CBC - Abnormal; Notable for the following:    RBC 3.95 (*)    Hemoglobin 12.5 (*)    HCT 36.7 (*)    Platelets 133 (*)    All other components within normal limits  I-STAT CG4 LACTIC ACID, ED - Abnormal; Notable for the following:    Lactic Acid, Venous 2.92 (*)    All other components within normal limits  CBG MONITORING, ED - Abnormal; Notable for the following:    Glucose-Capillary 100 (*)    All other components within normal limits  CBC WITH DIFFERENTIAL/PLATELET  URINALYSIS, ROUTINE W REFLEX MICROSCOPIC (NOT AT [facility])  LIPASE, BLOOD  PROTIME-INR  CREATININE, SERUM  TROPONIN I  TROPONIN I  TROPONIN I  I-STAT TROPOININ, ED  I-STAT TROPOININ, ED  I-STAT CG4 LACTIC ACID, ED    Imaging Review Ct Angio Chest Pe W/cm &/or Wo Cm  12/11/2014   CLINICAL DATA:  Acute chest pain, shortness of breath.  EXAM: CT ANGIOGRAPHY CHEST WITH CONTRAST  TECHNIQUE: Multidetector CT imaging of the chest was  performed using the standard protocol during bolus administration of intravenous contrast. Multiplanar CT image reconstructions and MIPs were obtained to evaluate the vascular anatomy.  CONTRAST:  OMNIPAQUE IOHEXOL 350 MG/ML SOLN  COMPARISON:  CT scan of November 21, 2011.  FINDINGS: No pneumothorax or pleural effusion is noted. No acute pulmonary disease is noted. Stable 3 mm nodule is noted anteriorly in the left upper lobe. This can be considered benign at this point with no further follow-up required. There is no evidence of pulmonary embolus. There is no evidence of thoracic aortic dissection or aneurysm. Visualized portion of upper abdomen appears normal. No mediastinal mass or adenopathy is noted. No significant osseous abnormality is noted.  Review of the MIP images confirms the above findings.  IMPRESSION: No evidence of pulmonary embolus. No acute cardiopulmonary abnormality seen.   Electronically Signed   By: Lupita Raider, M.D.   On: 12/11/2014 13:53   Ct Abdomen Pelvis W Contrast  12/11/2014   CLINICAL DATA:  Chest pain, epigastric pain, abdominal pain, shortness of Breath  EXAM: CT ABDOMEN AND PELVIS WITH CONTRAST  TECHNIQUE: Multidetector CT imaging of the abdomen and pelvis was performed using the standard protocol following bolus administration of intravenous contrast.  CONTRAST:  OMNIPAQUE IOHEXOL 350 MG/ML SOLN  COMPARISON:  None.  FINDINGS: Lung bases are unremarkable. Enhanced liver shows no focal mass. No calcified gallstones are noted within gallbladder.  Atherosclerotic calcifications and celiac trunk origin. There is no abdominal aortic aneurysm. Atherosclerotic calcifications of distal abdominal aorta. No periaortic leak.  The pancreas, spleen and adrenal glands are unremarkable.  Kidneys are symmetrical in size and enhancement. No hydronephrosis or hydroureter.  Delayed renal images shows bilateral renal symmetrical excretion. Bilateral visualized proximal ureter is  unremarkable.  There is no small bowel obstruction. No ascites or free air. No adenopathy.  No thickened or dilated small bowel loops are noted. Normal appendix is clearly visualized in coronal image 37. There is no pericecal inflammation.  IMPRESSION: 1. There is no evidence of inflammatory process within abdomen. 2. No hydronephrosis or hydroureter. Bilateral renal symmetrical excretion. 3. Normal appendix.  No pericecal inflammation. 4. There is thickening of urinary bladder wall. Clinical correlation is necessary to exclude cystitis, chronic inflammation or neoplastic process. Correlation with urology exam is recommended.   Electronically Signed   By: Natasha Mead M.D.   On: 12/11/2014 13:48   Dg Chest Port 1 View  12/11/2014   CLINICAL DATA:  Chest pain.  EXAM: PORTABLE CHEST - 1 VIEW  COMPARISON:  CT 11/21/2011.  FINDINGS: Mediastinum and hilar structures normal. Lungs are clear. Heart size normal. Calcified pulmonary nodule in the right mid lung field consistent granuloma. Degenerative changes noted both shoulders. Degenerative changes thoracic spine.  IMPRESSION: No acute cardiopulmonary disease. Calcified pulmonary nodule right mid lung consistent with granuloma.   Electronically Signed   By: Maisie Fus  Register   On: 12/11/2014 12:10     EKG Interpretation   Date/Time:  Thursday December 11 2014 10:33:17 EDT Ventricular Rate:  81 PR Interval:  169 QRS Duration: 92 QT Interval:  381 QTC Calculation: 442 R Axis:   -34 Text Interpretation:  Sinus rhythm Probable left atrial enlargement Left  axis deviation Abnormal R-wave progression, early transition Confirmed by  [name] [phone]) on 12/11/2014 10:58:11 AM      MDM   Final diagnoses:  Syncope and collapse  Chest pain, unspecified chest pain type    Patient here for evaluation of syncope, chest pain, abdominal pain. Patient with 2 syncopal events today, one of them in the emergency department while PE. Patient does have elevated lactate  onto collections. Second lactate is after 2 L of fluid. Patient without hypotension in the department, appears well perfused, no source of elevated lactate is unclear at this time. Discussed with hospitalist regarding observation for syncope as well as chest pain.    Tilden Fossa, MD 12/12/14 [phone]

## 2014-12-11 NOTE — ED Notes (Signed)
15:15 pt can go.[name]

## 2014-12-11 NOTE — ED Notes (Signed)
Dr Rees notified of lab result. 

## 2014-12-12 ENCOUNTER — Observation Stay (HOSPITAL_BASED_OUTPATIENT_CLINIC_OR_DEPARTMENT_OTHER): Payer: Medicare Other

## 2014-12-12 DIAGNOSIS — R55 Syncope and collapse: Secondary | ICD-10-CM

## 2014-12-12 DIAGNOSIS — R079 Chest pain, unspecified: Secondary | ICD-10-CM | POA: Diagnosis not present

## 2014-12-12 LAB — BASIC METABOLIC PANEL
Anion gap: 7 (ref 5–15)
BUN: 13 mg/dL (ref 6–20)
CALCIUM: 9 mg/dL (ref 8.9–10.3)
CO2: 29 mmol/L (ref 22–32)
Chloride: 105 mmol/L (ref 101–111)
Creatinine, Ser: 1.21 mg/dL (ref 0.61–1.24)
GFR calc Af Amer: >60 mL/min (ref >60)
GFR calc non Af Amer: >60 mL/min (ref >60)
GLUCOSE: 103 mg/dL — AB (ref 65–99)
Potassium: 3.3 mmol/L — ABNORMAL LOW (ref 3.5–5.1)
SODIUM: 141 mmol/L (ref 135–145)

## 2014-12-12 LAB — CBC
HCT: 36.7 % — ABNORMAL LOW (ref 39.0–52.0)
Hemoglobin: 12.5 g/dL — ABNORMAL LOW (ref 13.0–17.0)
MCH: 31.6 pg (ref 26.0–34.0)
MCHC: 34.1 g/dL (ref 30.0–36.0)
MCV: 92.9 fL (ref 78.0–100.0)
PLATELETS: 133 10*3/uL — AB (ref 150–400)
RBC: 3.95 MIL/uL — ABNORMAL LOW (ref 4.22–5.81)
RDW: 13.2 % (ref 11.5–15.5)
WBC: 6.4 10*3/uL (ref 4.0–10.5)

## 2014-12-12 LAB — TROPONIN I: Troponin I: <0.03 ng/mL (ref <0.031)

## 2014-12-12 MED ORDER — ADULT MULTIVITAMIN W/MINERALS CH: 1.0000 | ORAL_TABLET | Freq: Every day | ORAL | Status: AC

## 2014-12-12 MED ORDER — POTASSIUM CHLORIDE CRYS ER 20 MEQ PO TBCR: 20.0000 meq | EXTENDED_RELEASE_TABLET | Freq: Every day | ORAL | Status: AC

## 2014-12-12 MED ORDER — POTASSIUM CHLORIDE CRYS ER 20 MEQ PO TBCR
40.0000 meq | EXTENDED_RELEASE_TABLET | Freq: Once | ORAL | Status: DC
  Filled 2014-12-12: qty 2

## 2014-12-12 NOTE — Progress Notes (Signed)
Echocardiogram 2D Echocardiogram has been performed.  [name] 12/12/2014, 9:55 AM

## 2014-12-12 NOTE — Discharge Summary (Signed)
[facility]  Physician Discharge Summary   Patient ID: [name] MRN: [id number] DOB/AGE: Sep 09, 1950 64 y.o.  Admit date: 12/11/2014 Discharge date: 12/12/2014  PCP: At the [facility]  DISCHARGE DIAGNOSES:  Principal Problem:   Cocaine abuse Active Problems:   Chest pain   AKI (acute kidney injury)   Syncope   RECOMMENDATIONS FOR OUTPATIENT FOLLOW UP: 1. Patient to follow-up with his primary care physician at the [facility] for further management of abdominal pain and blood in stool   DISCHARGE CONDITION: fair  Diet recommendation: As before  Filed Weights   12/11/14 1539  Weight: 66.906 kg (147 lb 8 oz)    INITIAL HISTORY: A shunt is a 64 year old African-American male with a past medical history of hypertension, who also indulges in polysubstance abuse including cocaine and alcohol presented with complaints of chest pain and syncope. He was hospitalized for further management.  Consultations:  None  Procedures: 2-D echocardiogram Study Conclusions - Left ventricle: The cavity size was normal. Wall thickness wasnormal. Systolic function was normal. The estimated ejectionfraction was in the range of 60% to 65%. Wall motion was normal;there were no regional wall motion abnormalities. Features areconsistent with a pseudonormal left ventricular filling pattern,with concomitant abnormal relaxation and increased fillingpressure (grade 2 diastolic dysfunction).  HOSPITAL COURSE:   Chest pain Patient had atypical features. EKG did not show any ischemic changes. Troponins are normal. Pain could have been due to his cocaine use. Pain has resolved. Echocardiogram does not show any wall motion abnormalities. Diastolic dysfunction is noted, which was discussed with the patient. He will discuss this further with his primary care physician.  Syncope He had a syncopal episode while trimming hedges. Could've been due to dehydration and hypovolemia. Patient was noted to have  slightly elevated creatinine on initial examination. He was hydrated with IVF. He was monitored on telemetry. No arrhythmias were noted. Echocardiogram as above. He also underwent CT scan of his chest which did not show any PE. He was asked to follow-up with his PCP.  History of essential hypertension Continue home medications.  Acute Kidney Injury Resolved with IV fluids.  History of  polysubstance abuse including alcohol and cocaine Patient strongly advised to stop drinking alcohol and to stop using cocaine. Did not have any signs of alcohol withdrawal during this hospitalization.  Abdominal pain with blood on tissue paper This has been ongoing for 3 months. He has attempted to make appointments with his primary care provider at the [place]. Hemoglobin has been stable here. CT scan of the abdomen, pelvis did not show anything acute. He's been asked to follow-up with his primary care provider. He does admit to weight loss. He's been told that he will need a colonoscopy as soon as possible.  Potassium was repleted.  Patient is very keen on going home today. His workup has been completed. He feels back to baseline. He was ambulated in the hallway without any difficulty. He promises to follow-up with his primary care physician regarding his abdominal pain and blood on tissue paper. Okay for discharge today.   PERTINENT LABS:  The results of significant diagnostics from this hospitalization (including imaging, microbiology, ancillary and laboratory) are listed below for reference.      Labs: Basic Metabolic Panel:  Recent Labs Lab 12/11/14 1040 12/11/14 1634 12/12/14 0400  NA 137  --  141  K 3.6  --  3.3*  CL 99*  --  105  CO2 23  --  29  GLUCOSE 90  --  103*  BUN 20  --  13  CREATININE 1.41* 1.15 1.21  CALCIUM 9.8  --  9.0   Liver Function Tests:  Recent Labs Lab 12/11/14 1040  AST 49*  ALT 53  ALKPHOS 44  BILITOT 1.3*  PROT 7.8  ALBUMIN 4.4    Recent Labs Lab  12/11/14 1040  LIPASE 47   CBC:  Recent Labs Lab 12/11/14 1040 12/11/14 1634 12/12/14 0400  WBC 6.4 6.3 6.4  NEUTROABS 3.2  --   --   HGB 14.7 12.8* 12.5*  HCT 41.3 37.2* 36.7*  MCV 92.2 92.5 92.9  PLT 200 153 133*   Cardiac Enzymes:  Recent Labs Lab 12/11/14 1634 12/11/14 2150 12/12/14 0400  TROPONINI <0.03 <0.03 <0.03   CBG:  Recent Labs Lab 12/11/14 1027  GLUCAP 100*     IMAGING STUDIES Ct Angio Chest Pe W/cm &/or Wo Cm  12/11/2014   CLINICAL DATA:  Acute chest pain, shortness of breath.  EXAM: CT ANGIOGRAPHY CHEST WITH CONTRAST  TECHNIQUE: Multidetector CT imaging of the chest was performed using the standard protocol during bolus administration of intravenous contrast. Multiplanar CT image reconstructions and MIPs were obtained to evaluate the vascular anatomy.  CONTRAST:  OMNIPAQUE IOHEXOL 350 MG/ML SOLN  COMPARISON:  CT scan of November 21, 2011.  FINDINGS: No pneumothorax or pleural effusion is noted. No acute pulmonary disease is noted. Stable 3 mm nodule is noted anteriorly in the left upper lobe. This can be considered benign at this point with no further follow-up required. There is no evidence of pulmonary embolus. There is no evidence of thoracic aortic dissection or aneurysm. Visualized portion of upper abdomen appears normal. No mediastinal mass or adenopathy is noted. No significant osseous abnormality is noted.  Review of the MIP images confirms the above findings.  IMPRESSION: No evidence of pulmonary embolus. No acute cardiopulmonary abnormality seen.   Electronically Signed   By: Lupita Raider, M.D.   On: 12/11/2014 13:53   Ct Abdomen Pelvis W Contrast  12/11/2014   CLINICAL DATA:  Chest pain, epigastric pain, abdominal pain, shortness of Breath  EXAM: CT ABDOMEN AND PELVIS WITH CONTRAST  TECHNIQUE: Multidetector CT imaging of the abdomen and pelvis was performed using the standard protocol following bolus administration of intravenous contrast.   CONTRAST:  OMNIPAQUE IOHEXOL 350 MG/ML SOLN  COMPARISON:  None.  FINDINGS: Lung bases are unremarkable. Enhanced liver shows no focal mass. No calcified gallstones are noted within gallbladder.  Atherosclerotic calcifications and celiac trunk origin. There is no abdominal aortic aneurysm. Atherosclerotic calcifications of distal abdominal aorta. No periaortic leak.  The pancreas, spleen and adrenal glands are unremarkable.  Kidneys are symmetrical in size and enhancement. No hydronephrosis or hydroureter.  Delayed renal images shows bilateral renal symmetrical excretion. Bilateral visualized proximal ureter is unremarkable.  There is no small bowel obstruction. No ascites or free air. No adenopathy.  No thickened or dilated small bowel loops are noted. Normal appendix is clearly visualized in coronal image 37. There is no pericecal inflammation.  IMPRESSION: 1. There is no evidence of inflammatory process within abdomen. 2. No hydronephrosis or hydroureter. Bilateral renal symmetrical excretion. 3. Normal appendix.  No pericecal inflammation. 4. There is thickening of urinary bladder wall. Clinical correlation is necessary to exclude cystitis, chronic inflammation or neoplastic process. Correlation with urology exam is recommended.   Electronically Signed   By: Natasha Mead M.D.   On: 12/11/2014 13:48   Dg Chest [facility]  12/11/2014   CLINICAL DATA:  Chest pain.  EXAM: PORTABLE CHEST - 1 VIEW  COMPARISON:  CT 11/21/2011.  FINDINGS: Mediastinum and hilar structures normal. Lungs are clear. Heart size normal. Calcified pulmonary nodule in the right mid lung field consistent granuloma. Degenerative changes noted both shoulders. Degenerative changes thoracic spine.  IMPRESSION: No acute cardiopulmonary disease. Calcified pulmonary nodule right mid lung consistent with granuloma.   Electronically Signed   By: Maisie Fus  Register   On: 12/11/2014 12:10    DISCHARGE EXAMINATION: Filed Vitals:   12/11/14 1809  12/12/14 0000 12/12/14 0554 12/12/14 1038  BP: 135/76 139/68 138/80   Pulse: 70 58 62 85  Temp: 98.3 F (36.8 C) 98.1 F (36.7 C) 98.2 F (36.8 C)   TempSrc: Oral Oral Oral   Resp: 18 18 18    Height:      Weight:      SpO2: 100% 100% 100% 98%   General appearance: alert, cooperative, appears stated age and no distress Head: Normocephalic, without obvious abnormality, atraumatic Resp: clear to auscultation bilaterally Cardio: regular rate and rhythm, S1, S2 normal, no murmur, click, rub or gallop GI: soft, non-tender; bowel sounds normal; no masses,  no organomegaly Extremities: extremities normal, atraumatic, no cyanosis or edema Neurologic: No focal deficits  DISPOSITION: Home  Discharge Instructions    Call MD for:  difficulty breathing, headache or visual disturbances    Complete by:  As directed      Call MD for:  extreme fatigue    Complete by:  As directed      Call MD for:  persistant dizziness or light-headedness    Complete by:  As directed      Call MD for:  persistant nausea and vomiting    Complete by:  As directed      Call MD for:  redness, tenderness, or signs of infection (pain, swelling, redness, odor or green/yellow discharge around incision site)    Complete by:  As directed      Call MD for:  severe uncontrolled pain    Complete by:  As directed      Call MD for:  temperature >100.4    Complete by:  As directed      Diet - low sodium heart healthy    Complete by:  As directed      Discharge instructions    Complete by:  As directed   Please avoid doing illicit drugs. Please follow-up with your primary care physicians for further management of abdominal pain and blood in stool. Your ultrasound of the heart showed that your heart does not relax normally. You have diastolic dysfunction. Please discuss this with your doctor. Please return to the hospital if you develop chest pain, shortness of breath or pass out again.  You were cared for by a hospitalist  during your hospital stay. If you have any questions about your discharge medications or the care you received while you were in the hospital after you are discharged, you can call the unit and asked to speak with the hospitalist on call if the hospitalist that took care of you is not available. Once you are discharged, your primary care physician will handle any further medical issues. Please note that NO REFILLS for any discharge medications will be authorized once you are discharged, as it is imperative that you return to your primary care physician (or establish a relationship with a primary care physician if you do not have one) for your aftercare needs so  that they can reassess your need for medications and monitor your lab values. If you do not have a primary care physician, you can call [phone] for a physician referral.     Increase activity slowly    Complete by:  As directed            ALLERGIES:  Allergies  Allergen Reactions  . Ibuprofen Nausea Only  . Morphine And Related Hives and Itching  . Naproxen Nausea Only  . Tylenol [Acetaminophen] Nausea Only    In large quanities causes nausea.      Discharge Medication List as of 12/12/2014 12:15 PM    START taking these medications   Details  Multiple Vitamin (MULTIVITAMIN WITH MINERALS) TABS tablet Take 1 tablet by mouth daily., Starting 12/12/2014, Until Discontinued, Print    potassium chloride SA (K-DUR,KLOR-CON) 20 MEQ tablet Take 1 tablet (20 mEq total) by mouth daily. For 7 days, Starting 12/12/2014, Until Discontinued, Print      CONTINUE these medications which have NOT CHANGED   Details  aspirin 81 MG chewable tablet Chew 81 mg by mouth daily., Until Discontinued, Historical Med    cholecalciferol (VITAMIN D) 1000 UNITS tablet Take 1,000 Units by mouth daily., Until Discontinued, Historical Med    cyclobenzaprine (FLEXERIL) 10 MG tablet Take 10 mg by mouth 3 (three) times daily., Until Discontinued, Historical Med      hydrochlorothiazide (HYDRODIURIL) 25 MG tablet Take 25 mg by mouth daily., Until Discontinued, Historical Med    isosorbide mononitrate (IMDUR) 30 MG 24 hr tablet Take 30 mg by mouth daily., Until Discontinued, Historical Med    metoprolol tartrate (LOPRESSOR) 25 MG tablet Take 12.5 mg by mouth daily. Verified tartrate once daily, Until Discontinued, Historical Med    omeprazole (PRILOSEC) 20 MG capsule Take 20 mg by mouth daily., Until Discontinued, Historical Med    traMADol (ULTRAM) 50 MG tablet Take 1 tablet (50 mg total) by mouth every 6 (six) hours as needed., Starting 06/03/2014, Until Discontinued, Print       Follow-up Information    Follow up with Doctor at [facility]. Schedule an appointment as soon as possible for a visit in 1 week.   Why:  post hospitalization follow up      TOTAL DISCHARGE TIME: 35 minutes  [facility]  [facility] Pager [phone]  12/12/2014, 2:31 PM

## 2014-12-12 NOTE — Discharge Instructions (Signed)
Syncope °Syncope is a medical term for fainting or passing out. This means you lose consciousness and drop to the ground. People are generally unconscious for less than 5 minutes. You may have some muscle twitches for up to 15 seconds before waking up and returning to normal. Syncope occurs more often in older adults, but it can happen to anyone. While most causes of syncope are not dangerous, syncope can be a sign of a serious medical problem. It is important to seek medical care.  °CAUSES  °Syncope is caused by a sudden drop in blood flow to the brain. The specific cause is often not determined. Factors that can bring on syncope include: °· Taking medicines that lower blood pressure. °· Sudden changes in posture, such as standing up quickly. °· Taking more medicine than prescribed. °· Standing in one place for too long. °· Seizure disorders. °· Dehydration and excessive exposure to heat. °· Low blood sugar (hypoglycemia). °· Straining to have a bowel movement. °· Heart disease, irregular heartbeat, or other circulatory problems. °· Fear, emotional distress, seeing blood, or severe pain. °SYMPTOMS  °Right before fainting, you may: °· Feel dizzy or light-headed. °· Feel nauseous. °· See all white or all black in your field of vision. °· Have cold, clammy skin. °DIAGNOSIS  °Your health care provider will ask about your symptoms, perform a physical exam, and perform an electrocardiogram (ECG) to record the electrical activity of your heart. Your health care provider may also perform other heart or blood tests to determine the cause of your syncope which may include: °· Transthoracic echocardiogram (TTE). During echocardiography, sound waves are used to evaluate how blood flows through your heart. °· Transesophageal echocardiogram (TEE). °· Cardiac monitoring. This allows your health care provider to monitor your heart rate and rhythm in real time. °· Holter monitor. This is a portable device that records your  heartbeat and can help diagnose heart arrhythmias. It allows your health care provider to track your heart activity for several days, if needed. °· Stress tests by exercise or by giving medicine that makes the heart beat faster. °TREATMENT  °In most cases, no treatment is needed. Depending on the cause of your syncope, your health care provider may recommend changing or stopping some of your medicines. °HOME CARE INSTRUCTIONS °· Have someone stay with you until you feel stable. °· Do not drive, use machinery, or play sports until your health care provider says it is okay. °· Keep all follow-up appointments as directed by your health care provider. °· Lie down right away if you start feeling like you might faint. Breathe deeply and steadily. Wait until all the symptoms have passed. °· Drink enough fluids to keep your urine clear or pale yellow. °· If you are taking blood pressure or heart medicine, get up slowly and take several minutes to sit and then stand. This can reduce dizziness. °SEEK IMMEDIATE MEDICAL CARE IF:  °· You have a severe headache. °· You have unusual pain in the chest, abdomen, or back. °· You are bleeding from your mouth or rectum, or you have black or tarry stool. °· You have an irregular or very fast heartbeat. °· You have pain with breathing. °· You have repeated fainting or seizure-like jerking during an episode. °· You faint when sitting or lying down. °· You have confusion. °· You have trouble walking. °· You have severe weakness. °· You have vision problems. °If you fainted, call your local emergency services (911 in [place] Do not drive   yourself to the hospital.  °MAKE SURE YOU: °· Understand these instructions. °· Will watch your condition. °· Will get help right away if you are not doing well or get worse. °Document Released: 04/18/2005 Document Revised: 04/23/2013 Document Reviewed: 06/17/2011 °[employer]® Patient Information ©2015 [employer]. This information is not intended to replace  advice given to you by your health care provider. Make sure you discuss any questions you have with your health care provider. ° °

## 2015-06-29 IMAGING — CT CT HEAD W/O CM
4 of 6 series · 22 of 47 positions shown, 24 images · non-contrast
Comparison: None.

CLINICAL DATA: Syncope. Hit head. Chest throbbing and shortness of
breath. Concern for cervical spine injury. Initial encounter.

EXAM:
CT HEAD WITHOUT CONTRAST
CT CERVICAL SPINE WITHOUT CONTRAST
TECHNIQUE: Multidetector CT imaging of the head and cervical spine was
performed following the standard protocol without intravenous
contrast. Multiplanar CT image reconstructions of the cervical spine
were also generated.

[Series 302: soft tissue, idose (2) · axial · 0.26mm/px · z∈[-36,+90]mm · 8 of 83 slices shown, 10 images]
[im 10/83  brain]
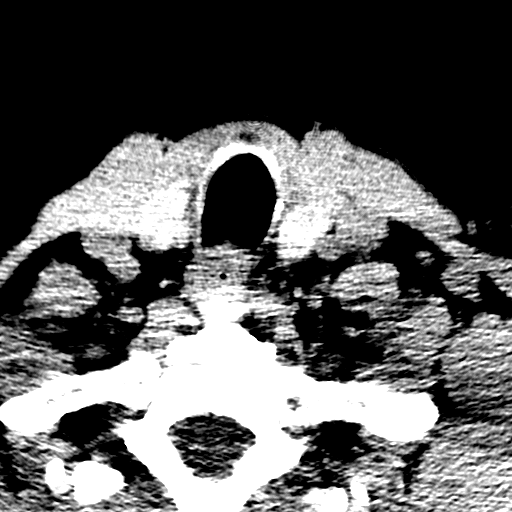
[im 10/83  bone]
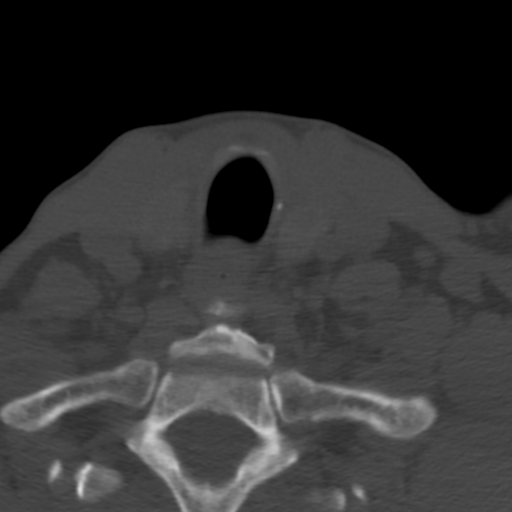
[im 19/83  brain]
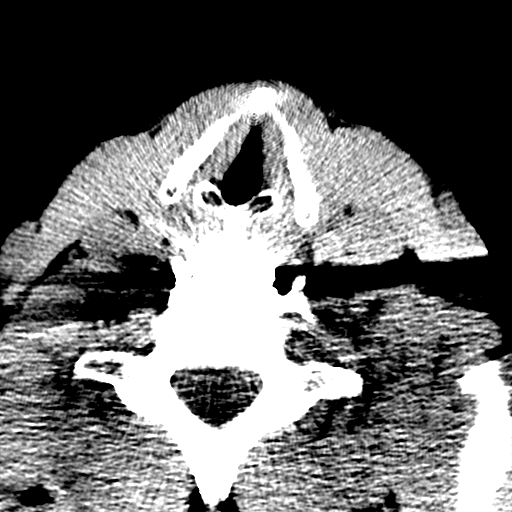
[im 28/83  brain]
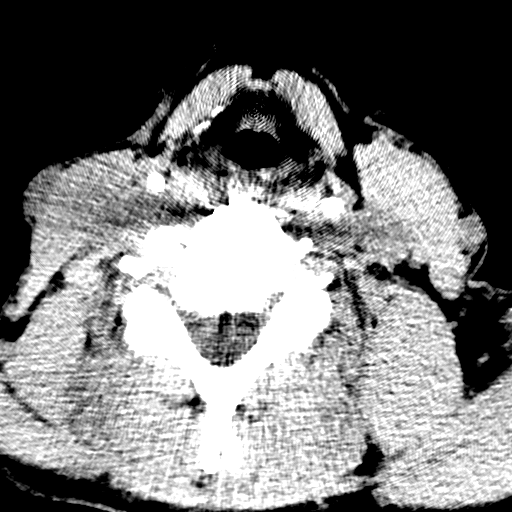
[im 37/83  brain]
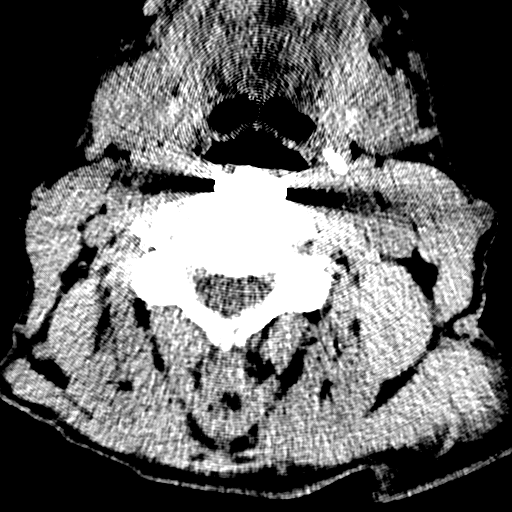
[im 46/83  brain]
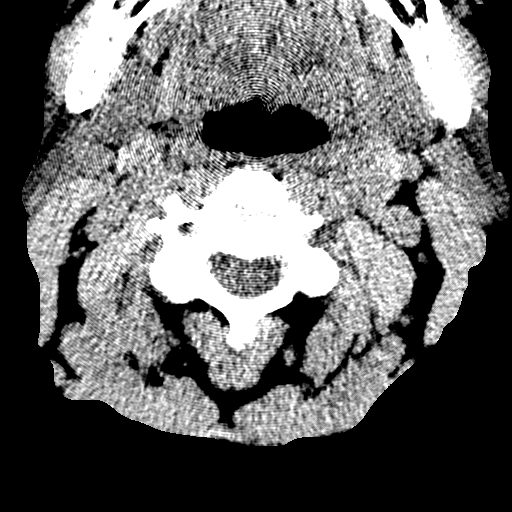
[im 46/83  bone]
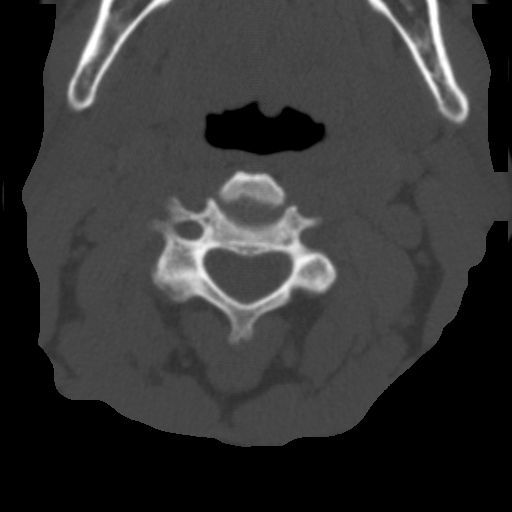
[im 55/83  brain]
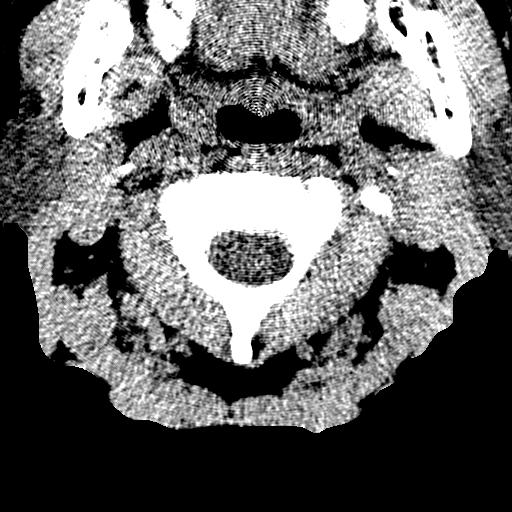
[im 64/83  brain]
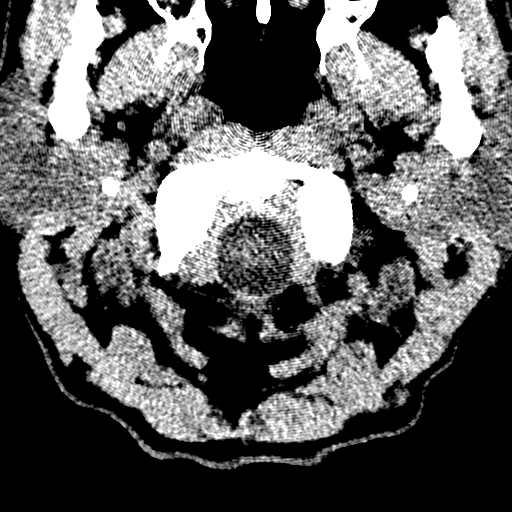
[im 73/83  brain]
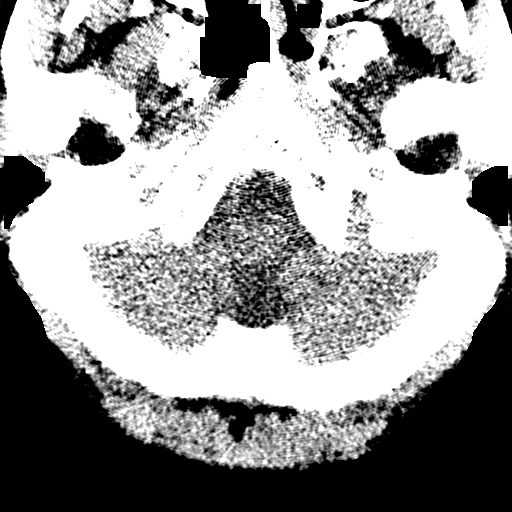

[Series 303: sagittal, idose (2) · sagittal · 0.34mm/px · 3 of 56 slices shown]
[im 19/56  brain]
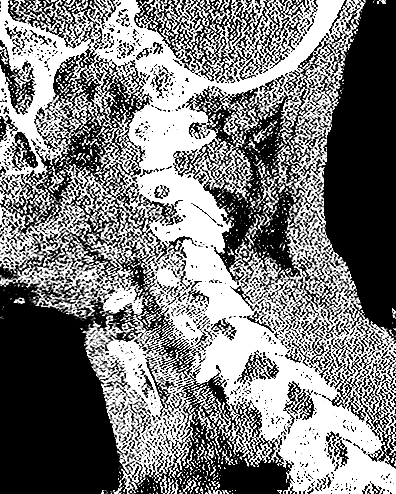
[im 28/56  brain]
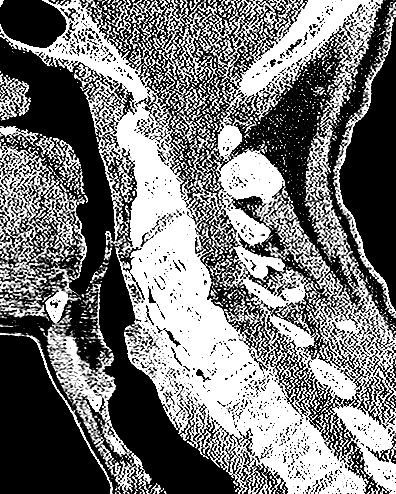
[im 37/56  brain]
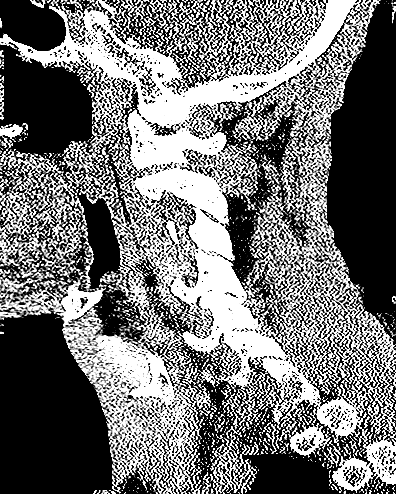

[Series 304: coronal, idose (2) · coronal · 0.35mm/px · 3 of 66 slices shown]
[im 22/66  brain]
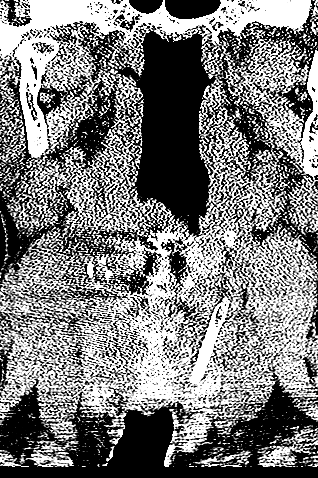
[im 29/66  brain]
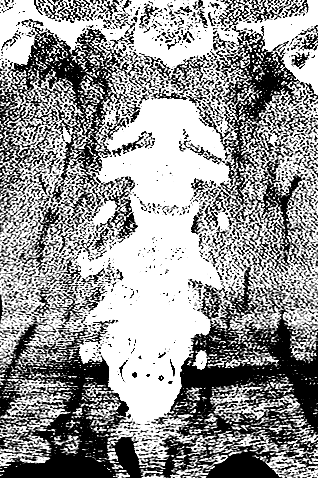
[im 37/66  brain]
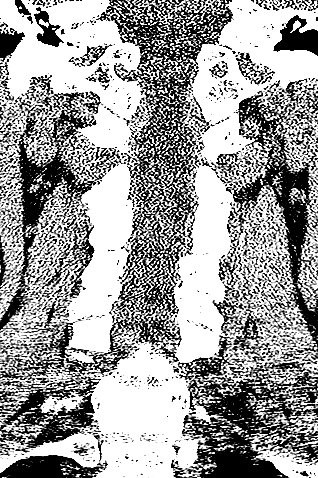

[Series 305: orthogonals, idose (2) · axial · 0.35mm/px · z∈[-53,+66]mm · 8 of 83 slices shown]
[im 10/83  brain]
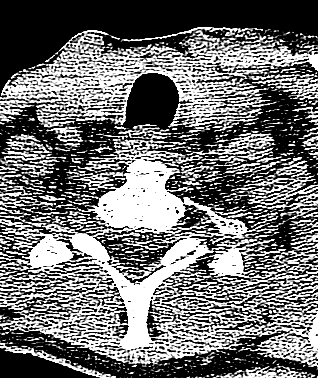
[im 19/83  brain]
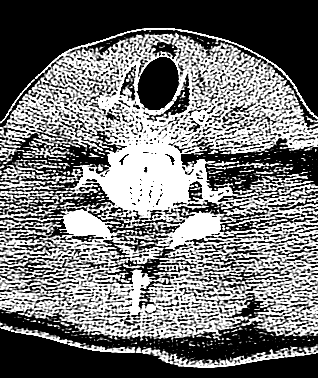
[im 28/83  brain]
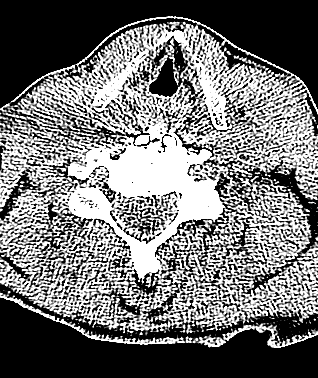
[im 37/83  brain]
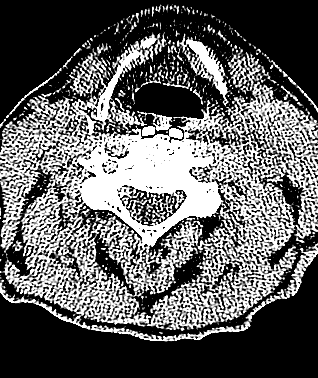
[im 46/83  brain]
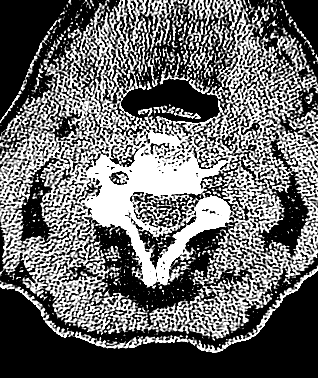
[im 55/83  brain]
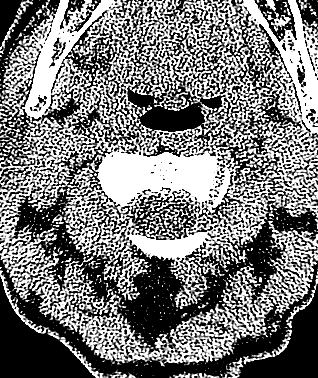
[im 64/83  brain]
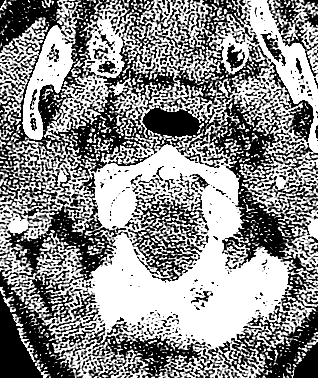
[im 73/83  brain]
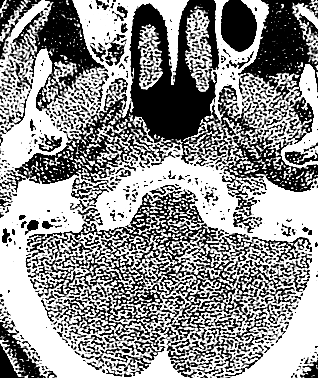

[22 of 47 positions shown; findings below may reference images not displayed]

FINDINGS: CT HEAD FINDINGS

There is no evidence of acute infarction, mass lesion, or intra- or
extra-axial hemorrhage on CT.

Prominence of the sulci suggests mild cortical volume loss. Mild
cerebellar atrophy is noted. Mild periventricular white matter
change likely reflects small vessel ischemic microangiopathy.

The brainstem and fourth ventricle are within normal limits. The
basal ganglia are unremarkable in appearance. The cerebral
hemispheres demonstrate grossly normal gray-white differentiation.
No mass effect or midline shift is seen.

There is no evidence of fracture; there is chronic deformity
involving the mandibular condylar heads bilaterally, reflecting
remote injury. The orbits are within normal limits. The paranasal
sinuses and mastoid air cells are well-aerated. No significant soft
tissue abnormalities are seen.

CT CERVICAL SPINE FINDINGS

There is no evidence of fracture or subluxation. Vertebral bodies
demonstrate normal height and alignment. The patient is status post
anterior cervical spinal fusion at C3-C6, with mild associated
degenerative change. Intervertebral disc spaces are otherwise
preserved. Prevertebral soft tissues are within normal limits.

The thyroid gland is unremarkable in appearance. The visualized lung
apices are clear. No significant soft tissue abnormalities are seen.
IMPRESSION: 1. No evidence of traumatic intracranial injury or fracture.
2. No evidence of fracture or subluxation along the cervical spine.
Status post anterior cervical spinal fusion at C3-C6.
3. Mild cortical volume loss and scattered small vessel ischemic
microangiopathy.
4. Chronic deformity involving the mandibular condylar heads
bilaterally, reflecting remote injury.

## 2016-01-06 IMAGING — CR DG CHEST 1V PORT
1 series · 1 of 1 positions shown · non-contrast
Comparison: CT 11/21/2011.

CLINICAL DATA: Chest pain.

EXAM:
PORTABLE CHEST - 1 VIEW

[AP]
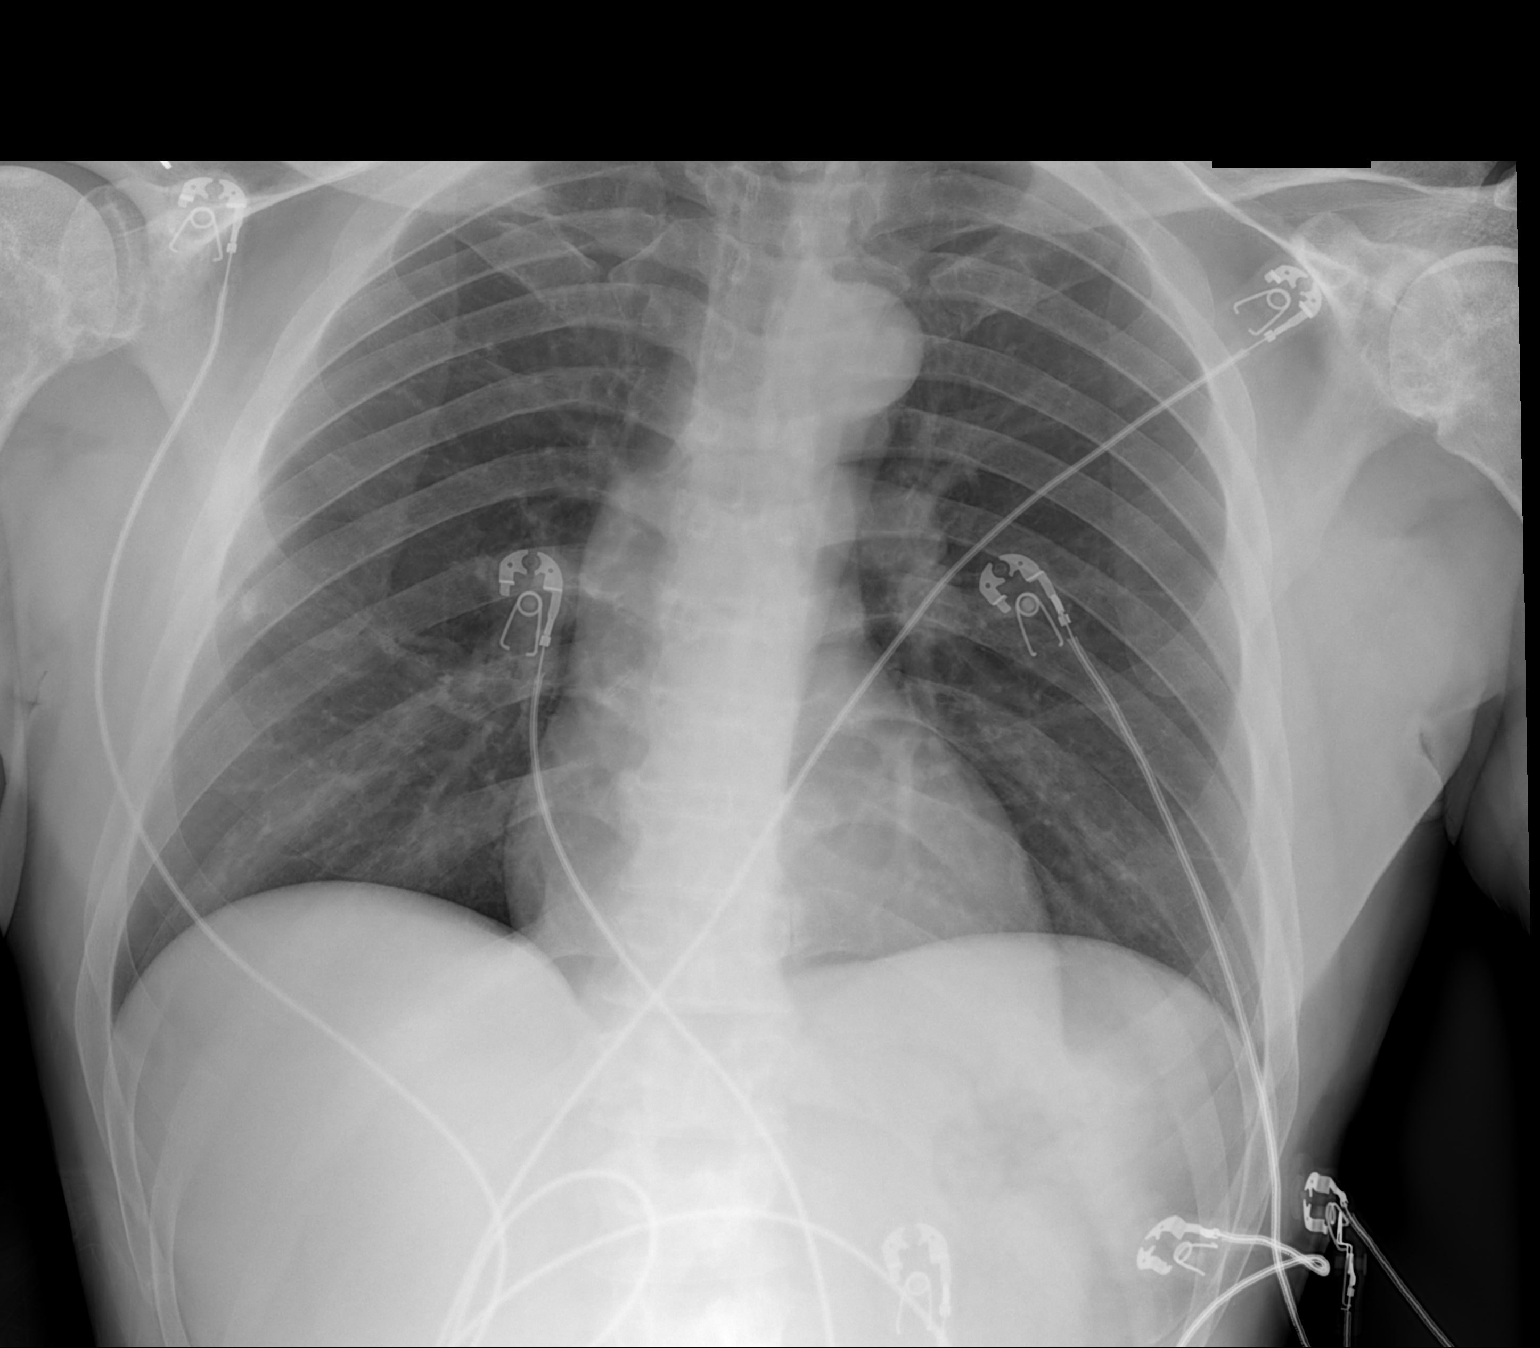

[1 of 1 positions shown; findings below may reference images not displayed]

FINDINGS: Mediastinum and hilar structures normal. Lungs are clear. Heart size
normal. Calcified pulmonary nodule in the right mid lung field
consistent granuloma. Degenerative changes noted both shoulders.
Degenerative changes thoracic spine.
IMPRESSION: No acute cardiopulmonary disease. Calcified pulmonary nodule right
mid lung consistent with granuloma.
